# Patient Record
Sex: Male | Born: 1955
Health system: Southern US, Community
[De-identification: ages and names within clinical notes are randomized; demographics above are authoritative.]

## PROBLEM LIST (undated history)

## (undated) DIAGNOSIS — K5790 Diverticulosis of intestine, part unspecified, without perforation or abscess without bleeding: Secondary | ICD-10-CM

## (undated) DIAGNOSIS — Z789 Other specified health status: Secondary | ICD-10-CM

## (undated) HISTORY — PX: NO PAST SURGERIES: SHX2092

---

## 2001-07-08 ENCOUNTER — Ambulatory Visit (HOSPITAL_COMMUNITY): Admission: RE | Admit: 2001-07-08 | Discharge: 2001-07-08 | Payer: Self-pay | Admitting: Gastroenterology

## 2001-07-21 ENCOUNTER — Ambulatory Visit (HOSPITAL_COMMUNITY): Admission: RE | Admit: 2001-07-21 | Discharge: 2001-07-21 | Payer: Self-pay | Admitting: Gastroenterology

## 2001-07-21 ENCOUNTER — Encounter: Payer: Self-pay | Admitting: Gastroenterology

## 2003-10-30 ENCOUNTER — Emergency Department (HOSPITAL_COMMUNITY): Admission: EM | Admit: 2003-10-30 | Discharge: 2003-10-30 | Payer: Self-pay | Admitting: Family Medicine

## 2013-05-09 ENCOUNTER — Encounter (HOSPITAL_COMMUNITY): Payer: Self-pay | Admitting: Emergency Medicine

## 2013-05-09 DIAGNOSIS — Z79899 Other long term (current) drug therapy: Secondary | ICD-10-CM

## 2013-05-09 DIAGNOSIS — K659 Peritonitis, unspecified: Principal | ICD-10-CM | POA: Diagnosis present

## 2013-05-09 DIAGNOSIS — K5732 Diverticulitis of large intestine without perforation or abscess without bleeding: Secondary | ICD-10-CM | POA: Diagnosis present

## 2013-05-09 NOTE — ED Notes (Signed)
C/o LLQ pain since 3am.  Denies nausea and vomiting.  Reports constipation earlier today- took laxative and reports (liquid) BM 1 hour ago.  Pain worse after BM.

## 2013-05-10 ENCOUNTER — Encounter (HOSPITAL_COMMUNITY): Payer: Self-pay | Admitting: Radiology

## 2013-05-10 ENCOUNTER — Emergency Department (HOSPITAL_COMMUNITY): Payer: Self-pay

## 2013-05-10 ENCOUNTER — Inpatient Hospital Stay (HOSPITAL_COMMUNITY)
Admission: EM | Admit: 2013-05-10 | Discharge: 2013-05-14 | DRG: 372 | Disposition: A | Discharge status: Home / Self Care | Payer: Self-pay | Attending: Surgery | Admitting: Surgery

## 2013-05-10 DIAGNOSIS — K5720 Diverticulitis of large intestine with perforation and abscess without bleeding: Secondary | ICD-10-CM | POA: Diagnosis present

## 2013-05-10 DIAGNOSIS — K5792 Diverticulitis of intestine, part unspecified, without perforation or abscess without bleeding: Secondary | ICD-10-CM

## 2013-05-10 DIAGNOSIS — K5732 Diverticulitis of large intestine without perforation or abscess without bleeding: Secondary | ICD-10-CM

## 2013-05-10 HISTORY — DX: Other specified health status: Z78.9

## 2013-05-10 LAB — COMPREHENSIVE METABOLIC PANEL
ALT: 17 U/L (ref 0–53)
ALT: 21 U/L (ref 0–53)
Alkaline Phosphatase: 55 U/L (ref 39–117)
Alkaline Phosphatase: 57 U/L (ref 39–117)
BUN: 13 mg/dL (ref 6–23)
CO2: 26 mEq/L (ref 19–32)
CO2: 30 mEq/L (ref 19–32)
Calcium: 8.7 mg/dL (ref 8.4–10.5)
Calcium: 9.1 mg/dL (ref 8.4–10.5)
Chloride: 98 mEq/L (ref 96–112)
Creatinine, Ser: 1.12 mg/dL (ref 0.50–1.35)
GFR calc Af Amer: 83 mL/min — ABNORMAL LOW (ref >90)
GFR calc Af Amer: 88 mL/min — ABNORMAL LOW (ref >90)
GFR calc non Af Amer: 72 mL/min — ABNORMAL LOW (ref >90)
GFR calc non Af Amer: 76 mL/min — ABNORMAL LOW (ref >90)
Glucose, Bld: 116 mg/dL — ABNORMAL HIGH (ref 70–99)
Glucose, Bld: 121 mg/dL — ABNORMAL HIGH (ref 70–99)
Potassium: 3.5 mEq/L (ref 3.5–5.1)
Potassium: 3.5 mEq/L (ref 3.5–5.1)
Sodium: 133 mEq/L — ABNORMAL LOW (ref 135–145)
Sodium: 135 mEq/L (ref 135–145)
Total Bilirubin: 1.2 mg/dL (ref 0.3–1.2)
Total Bilirubin: 1.6 mg/dL — ABNORMAL HIGH (ref 0.3–1.2)
Total Protein: 7.8 g/dL (ref 6.0–8.3)

## 2013-05-10 LAB — CBC WITH DIFFERENTIAL/PLATELET
Basophils Absolute: 0 10*3/uL (ref 0.0–0.1)
Basophils Relative: 0 % (ref 0–1)
Eosinophils Absolute: 0 10*3/uL (ref 0.0–0.7)
Eosinophils Relative: 0 % (ref 0–5)
HCT: 42.7 % (ref 39.0–52.0)
Hemoglobin: 14.3 g/dL (ref 13.0–17.0)
Lymphs Abs: 1.9 10*3/uL (ref 0.7–4.0)
MCHC: 33.3 g/dL (ref 30.0–36.0)
MCV: 78.3 fL (ref 78.0–100.0)
Monocytes Relative: 8 % (ref 3–12)
Neutro Abs: 10.5 10*3/uL — ABNORMAL HIGH (ref 1.7–7.7)
Neutro Abs: 8.3 10*3/uL — ABNORMAL HIGH (ref 1.7–7.7)
Neutrophils Relative %: 75 % (ref 43–77)
Neutrophils Relative %: 84 % — ABNORMAL HIGH (ref 43–77)
Platelets: 165 10*3/uL (ref 150–400)
Platelets: 197 10*3/uL (ref 150–400)
RBC: 5.45 MIL/uL (ref 4.22–5.81)
RDW: 13.9 % (ref 11.5–15.5)
RDW: 14 % (ref 11.5–15.5)
WBC: 11.1 10*3/uL — ABNORMAL HIGH (ref 4.0–10.5)

## 2013-05-10 LAB — URINALYSIS, ROUTINE W REFLEX MICROSCOPIC
Bilirubin Urine: NEGATIVE
Glucose, UA: NEGATIVE mg/dL
Ketones, ur: NEGATIVE mg/dL
Leukocytes, UA: NEGATIVE
Nitrite: NEGATIVE
Protein, ur: NEGATIVE mg/dL
Specific Gravity, Urine: 1.005 (ref 1.005–1.030)
Urobilinogen, UA: 0.2 mg/dL (ref 0.0–1.0)
pH: 7 (ref 5.0–8.0)

## 2013-05-10 LAB — LIPASE, BLOOD: Lipase: 30 U/L (ref 11–59)

## 2013-05-10 LAB — URINE MICROSCOPIC-ADD ON

## 2013-05-10 LAB — GLUCOSE, CAPILLARY
Glucose-Capillary: 106 mg/dL — ABNORMAL HIGH (ref 70–99)
Glucose-Capillary: 108 mg/dL — ABNORMAL HIGH (ref 70–99)
Glucose-Capillary: 125 mg/dL — ABNORMAL HIGH (ref 70–99)

## 2013-05-10 MED ORDER — IOHEXOL 300 MG/ML  SOLN
100.0000 mL | Freq: Once | INTRAMUSCULAR | Status: AC | PRN
  Administered 2013-05-10: 100 mL via INTRAVENOUS

## 2013-05-10 MED ORDER — ONDANSETRON HCL 4 MG PO TABS: 4.0000 mg | ORAL_TABLET | Freq: Four times a day (QID) | ORAL | Status: DC | PRN

## 2013-05-10 MED ORDER — PIPERACILLIN-TAZOBACTAM 3.375 G IVPB 30 MIN
3.3750 g | Freq: Once | INTRAVENOUS | Status: AC
  Administered 2013-05-10: 3.375 g via INTRAVENOUS
  Filled 2013-05-10: qty 50

## 2013-05-10 MED ORDER — IOHEXOL 300 MG/ML  SOLN: 25.0000 mL | Freq: Once | INTRAMUSCULAR | Status: DC | PRN

## 2013-05-10 MED ORDER — CIPROFLOXACIN IN D5W 400 MG/200ML IV SOLN
400.0000 mg | Freq: Once | INTRAVENOUS | Status: AC
  Administered 2013-05-10: 400 mg via INTRAVENOUS
  Filled 2013-05-10: qty 200

## 2013-05-10 MED ORDER — HYDROMORPHONE HCL PF 1 MG/ML IJ SOLN
0.5000 mg | INTRAMUSCULAR | Status: DC | PRN
  Administered 2013-05-10 – 2013-05-13 (×9): 0.5 mg via INTRAVENOUS
  Filled 2013-05-10 (×9): qty 1

## 2013-05-10 MED ORDER — SODIUM CHLORIDE 0.9 % IV SOLN
INTRAVENOUS | Status: AC
  Administered 2013-05-10 – 2013-05-11 (×2): via INTRAVENOUS

## 2013-05-10 MED ORDER — ACETAMINOPHEN 650 MG RE SUPP: 650.0000 mg | Freq: Four times a day (QID) | RECTAL | Status: DC | PRN

## 2013-05-10 MED ORDER — ACETAMINOPHEN 325 MG PO TABS
650.0000 mg | ORAL_TABLET | Freq: Four times a day (QID) | ORAL | Status: DC | PRN
  Administered 2013-05-10 (×3): 650 mg via ORAL
  Filled 2013-05-10 (×3): qty 2

## 2013-05-10 MED ORDER — ENOXAPARIN SODIUM 40 MG/0.4ML ~~LOC~~ SOLN
40.0000 mg | SUBCUTANEOUS | Status: DC
  Administered 2013-05-10 – 2013-05-14 (×5): 40 mg via SUBCUTANEOUS
  Filled 2013-05-10 (×5): qty 0.4

## 2013-05-10 MED ORDER — ONDANSETRON HCL 4 MG/2ML IJ SOLN: 4.0000 mg | Freq: Four times a day (QID) | INTRAMUSCULAR | Status: DC | PRN

## 2013-05-10 MED ORDER — PIPERACILLIN-TAZOBACTAM 3.375 G IVPB
3.3750 g | Freq: Three times a day (TID) | INTRAVENOUS | Status: DC
  Administered 2013-05-10 – 2013-05-14 (×12): 3.375 g via INTRAVENOUS
  Filled 2013-05-10 (×14): qty 50

## 2013-05-10 MED ORDER — MORPHINE SULFATE 4 MG/ML IJ SOLN
4.0000 mg | Freq: Once | INTRAMUSCULAR | Status: AC
  Administered 2013-05-10: 4 mg via INTRAVENOUS
  Filled 2013-05-10: qty 1

## 2013-05-10 MED ORDER — METRONIDAZOLE IN NACL 5-0.79 MG/ML-% IV SOLN
500.0000 mg | Freq: Once | INTRAVENOUS | Status: AC
Start: 2013-05-10 — End: 2013-05-10
  Administered 2013-05-10: 500 mg via INTRAVENOUS
  Filled 2013-05-10: qty 100

## 2013-05-10 NOTE — ED Notes (Signed)
Pt states he has been having some ABD pain in his left lower quadrant.  Pt states the pain was worse and he thought that he might have just been constipated.  His family gave him some milk of magnesia which allowed him to have a liquid BM around 2 hours prior.

## 2013-05-10 NOTE — Progress Notes (Signed)
Pt has a fever of 101.7.  MD notified and 650mg  PO Tylenol given.  Will continue to monitor.  Hector Shade [place]

## 2013-05-10 NOTE — Progress Notes (Signed)
Subjective: Pt feels much better.  Still tender in LLQ.  No N/V.  Ambulating OOB.  Not hungry.  Urinating well and having flatus/BM's.  Objective: Vital signs in last 24 hours: Temp:  [98.8 F (37.1 C)-101 F (38.3 C)] 101 F (38.3 C) (12/08 0545) Pulse Rate:  [72-81] 78 (12/08 0545) Resp:  [18] 18 (12/07 2333) BP: (117-126)/(66-72) 117/72 mmHg (12/08 0545) SpO2:  [96 %-98 %] 98 % (12/08 0545) Weight:  [161 lb 3.2 oz (73.12 kg)] 161 lb 3.2 oz (73.12 kg) (12/08 0545) Last BM Date: 05/09/13  Intake/Output from previous day:   Intake/Output this shift:    PE: Gen:  Alert, NAD, pleasant Abd: Soft, ND, still moderately tender in LLQ, +BS, no HSM, no surgical scars noted   Lab Results:   Recent Labs  05/09/13 2355 05/10/13 0715  WBC 11.1* 12.5*  HGB 14.3 13.3  HCT 42.7 40.0  PLT 197 165   BMET  Recent Labs  05/09/13 2355 05/10/13 0715  NA 135 133*  K 3.5 3.5  CL 98 98  CO2 30 26  GLUCOSE 116* 121*  BUN 13 12  CREATININE 1.12 1.07  CALCIUM 9.1 8.7   PT/INR No results found for this basename: LABPROT, INR,  in the last 72 hours CMP     Component Value Date/Time   NA 133* 05/10/2013 0715   K 3.5 05/10/2013 0715   CL 98 05/10/2013 0715   CO2 26 05/10/2013 0715   GLUCOSE 121* 05/10/2013 0715   BUN 12 05/10/2013 0715   CREATININE 1.07 05/10/2013 0715   CALCIUM 8.7 05/10/2013 0715   PROT 7.4 05/10/2013 0715   ALBUMIN 3.5 05/10/2013 0715   AST 16 05/10/2013 0715   ALT 17 05/10/2013 0715   ALKPHOS 55 05/10/2013 0715   BILITOT 1.6* 05/10/2013 0715   GFRNONAA 76* 05/10/2013 0715   GFRAA 88* 05/10/2013 0715   Lipase     Component Value Date/Time   LIPASE 30 05/09/2013 2355       Studies/Results: Ct Abdomen Pelvis W Contrast  05/10/2013   CLINICAL DATA:  Left lower quadrant abdominal pain.  EXAM: CT ABDOMEN AND PELVIS WITH CONTRAST  TECHNIQUE: Multidetector CT imaging of the abdomen and pelvis was performed using the standard protocol following bolus  administration of intravenous contrast.  CONTRAST:  OMNIPAQUE IOHEXOL 300 MG/ML  SOLN  COMPARISON:  None.  FINDINGS: Minimal bibasilar atelectasis is noted.  The liver and spleen are unremarkable in appearance. The gallbladder is within normal limits. The pancreas and adrenal glands are unremarkable.  The kidneys are unremarkable in appearance. There is no evidence of hydronephrosis. No renal or ureteral stones are seen. No perinephric stranding is appreciated.  No free fluid is identified. The small bowel is unremarkable in appearance. The stomach is within normal limits. No acute vascular abnormalities are seen.  There is focal soft tissue inflammation along the proximal sigmoid colon, with associated diverticula, compatible with acute diverticulitis. A small amount of associated free fluid is seen tracking at the left lower quadrant, with a focus of free air. A tiny focus of air is noted under the diaphragm. Findings are concerning for a small focal perforation, relatively contained in nature.  Scattered diverticulosis is noted along the descending and proximal sigmoid colon.  The appendix is normal in caliber, without definite evidence of appendicitis.  The bladder is mildly distended and grossly unremarkable in appearance. The prostate remains normal in size. No inguinal lymphadenopathy is seen.  No acute osseous abnormalities  are identified.  IMPRESSION: 1. Focal soft tissue inflammation along the proximal sigmoid colon, with associated diverticula, compatible with acute diverticulitis. Small amount of associated free fluid seen at the left lower quadrant, with a focus of free air noted. Tiny focus of air also noted under the diaphragm. Findings concerning for small focal perforation, relatively contained in nature. No evidence of abscess. 2. Scattered diverticulosis noted along the descending and proximal sigmoid colon.  These results were discussed in person at the time of interpretation on 05/10/2013  at 3:20 AM with Dr. Marisa Severin , who verbally acknowledged these results.   Electronically Signed   By: Roanna Raider M.D.   On: 05/10/2013 03:35    Anti-infectives: Anti-infectives   Start     Dose/Rate Route Frequency Ordered Stop   05/10/13 1200  piperacillin-tazobactam (ZOSYN) IVPB 3.375 g     3.375 g 12.5 mL/hr over 240 Minutes Intravenous Every 8 hours 05/10/13 0551     05/10/13 0600  piperacillin-tazobactam (ZOSYN) IVPB 3.375 g     3.375 g 100 mL/hr over 30 Minutes Intravenous  Once 05/10/13 0551 05/10/13 0703   05/10/13 0345  metroNIDAZOLE (FLAGYL) IVPB 500 mg     500 mg 100 mL/hr over 60 Minutes Intravenous  Once 05/10/13 0333 05/10/13 0450   05/10/13 0345  ciprofloxacin (CIPRO) IVPB 400 mg     400 mg 200 mL/hr over 60 Minutes Intravenous  Once 05/10/13 0333 05/10/13 0506       Assessment/Plan HD #2 Sigmoid diverticulitis with microperforation and localized peritonitis Leukocytosis 1.  NPO, IVF, pain control, antiemetics, IV antibiotics 2.  Hopefully can treat conservatively for now and avoid surgical intervention, hopefully advance diet tomorrow to clears 3.  Follow WBC (went up to 12.5 today from 11.1), repeat tomorrow 4.  Ambulate and IS 5.  SCD's and lovenox 6.  Will discuss with Dr. Gerrit Friends about taking him on our service    LOS: 0 days    Aris [place] 05/10/2013, 8:46 AM Pager: [phone]

## 2013-05-10 NOTE — H&P (Signed)
[facility] History and Physical  Jonathan Boyer HYQ:[id number] DOB: [birth year] DOA: 05/10/2013  Referring physician: ER physician. PCP: No primary provider on file.   Chief Complaint: Abdominal pain.  HPI: Jonathan Boyer is a 57 y.o. male with no significant past medical history presented to the ER with complaints of left lower quadrant pain. Patient's symptoms started yesterday morning with sudden onset of pain and he had to leave his work place due to the pain. Pain remained constant. Later because he did not have a bowel movement he tried milk of magnesia with no results. Due to the persistent nature of the symptoms he came to the ER and CT abdomen pelvis done showed sigmoid diverticulitis with perforation. On-call surgeon Dr. Luisa Hart was consulted. Patient has been admitted for further management. Patient denies any chest pain or shortness of breath nausea vomiting.  Review of Systems: As presented in the history of presenting illness, rest negative.  Past Medical History  Diagnosis Date  . Medical history non-contributory    Past Surgical History  Procedure Laterality Date  . No past surgeries     Social History:  reports that he has never smoked. He does not have any smokeless tobacco history on file. He reports that he drinks alcohol. He reports that he does not use illicit drugs. Where does patient live home. Can patient participate in ADLs? Yes.  No Known Allergies  Family History:  Family History  Problem Relation Age of Onset  . Cancer Mother       Prior to Admission medications   Medication Sig Start Date End Date Taking? Authorizing Provider  ibuprofen (ADVIL,MOTRIN) 200 MG tablet Take 200 mg by mouth every 6 (six) hours as needed.   Yes Historical Provider, MD  magnesium hydroxide (MILK OF MAGNESIA) 400 MG/5ML suspension Take 45 mLs by mouth 2 (two) times daily as needed for indigestion.    Yes Historical Provider, MD    Physical Exam: Filed Vitals:   05/10/13 0210 05/10/13 0215 05/10/13 0451 05/10/13 0500  BP:  126/70 122/72 122/66  Pulse: 79 76 81 75  Temp:      TempSrc:      Resp:      SpO2: 96% 97% 98% 97%     General:  Well-developed well-nourished.  Eyes: Anicteric no pallor.  ENT: No discharge from ears eyes nose mouth.  Neck: No mass felt.  Cardiovascular: S1-S2 heard.  Respiratory: No rhonchi or crepitations.  Abdomen: Left lower quadrant tenderness no guarding or rigidity.  Skin: No rash.  Musculoskeletal: No edema.  Psychiatric: Appears normal.  Neurologic: Alert awake oriented to time place and person. Moves all extremities.  Labs on Admission:  Basic Metabolic Panel:  Recent Labs Lab 05/09/13 2355  NA 135  K 3.5  CL 98  CO2 30  GLUCOSE 116*  BUN 13  CREATININE 1.12  CALCIUM 9.1   Liver Function Tests:  Recent Labs Lab 05/09/13 2355  AST 20  ALT 21  ALKPHOS 57  BILITOT 1.2  PROT 7.8  ALBUMIN 3.8    Recent Labs Lab 05/09/13 2355  LIPASE 30   No results found for this basename: AMMONIA,  in the last 168 hours CBC:  Recent Labs Lab 05/09/13 2355  WBC 11.1*  NEUTROABS 8.3*  HGB 14.3  HCT 42.7  MCV 78.3  PLT 197   Cardiac Enzymes: No results found for this basename: CKTOTAL, CKMB, CKMBINDEX, TROPONINI,  in the last 168 hours  BNP (last 3 results) No results found for  this basename: PROBNP,  in the last 8760 hours CBG: No results found for this basename: GLUCAP,  in the last 168 hours  Radiological Exams on Admission: Ct Abdomen Pelvis W Contrast  05/10/2013   CLINICAL DATA:  Left lower quadrant abdominal pain.  EXAM: CT ABDOMEN AND PELVIS WITH CONTRAST  TECHNIQUE: Multidetector CT imaging of the abdomen and pelvis was performed using the standard protocol following bolus administration of intravenous contrast.  CONTRAST:  OMNIPAQUE IOHEXOL 300 MG/ML  SOLN  COMPARISON:  None.  FINDINGS: Minimal bibasilar atelectasis is noted.  The liver and spleen are unremarkable  in appearance. The gallbladder is within normal limits. The pancreas and adrenal glands are unremarkable.  The kidneys are unremarkable in appearance. There is no evidence of hydronephrosis. No renal or ureteral stones are seen. No perinephric stranding is appreciated.  No free fluid is identified. The small bowel is unremarkable in appearance. The stomach is within normal limits. No acute vascular abnormalities are seen.  There is focal soft tissue inflammation along the proximal sigmoid colon, with associated diverticula, compatible with acute diverticulitis. A small amount of associated free fluid is seen tracking at the left lower quadrant, with a focus of free air. A tiny focus of air is noted under the diaphragm. Findings are concerning for a small focal perforation, relatively contained in nature.  Scattered diverticulosis is noted along the descending and proximal sigmoid colon.  The appendix is normal in caliber, without definite evidence of appendicitis.  The bladder is mildly distended and grossly unremarkable in appearance. The prostate remains normal in size. No inguinal lymphadenopathy is seen.  No acute osseous abnormalities are identified.  IMPRESSION: 1. Focal soft tissue inflammation along the proximal sigmoid colon, with associated diverticula, compatible with acute diverticulitis. Small amount of associated free fluid seen at the left lower quadrant, with a focus of free air noted. Tiny focus of air also noted under the diaphragm. Findings concerning for small focal perforation, relatively contained in nature. No evidence of abscess. 2. Scattered diverticulosis noted along the descending and proximal sigmoid colon.  These results were discussed in person at the time of interpretation on 05/10/2013 at 3:20 AM with Dr. Marisa Severin , who verbally acknowledged these results.   Electronically Signed   By: Roanna Raider M.D.   On: 05/10/2013 03:35     Assessment/Plan Principal Problem:    Diverticulitis of colon with perforation Active Problems:   Diverticulitis   1. Diverticulitis of the sigmoid colon with perforation - patient has been placed n.p.o. IV antibiotics with gentle hydration. Pain relief medications. Further recommendations per surgery.    Code Status: Full code.  Family Communication: Family at the bedside.  Disposition Plan: Admit to inpatient.    KAKRAKANDY,ARSHAD N. [facility] Pager [phone].  If 7PM-7AM, please contact night-coverage [email or web address] Password [email or web address] 05/10/2013, 5:07 AM

## 2013-05-10 NOTE — ED Provider Notes (Signed)
CSN: [id number]     Arrival date & time 05/09/13  2319 History   First MD Initiated Contact with Patient 05/10/13 0142     Chief Complaint  Patient presents with  . Abdominal Pain   (Consider location/radiation/quality/duration/timing/severity/associated sxs/prior Treatment) HPI 57 year old male presents to emergency room with complaint of left lower quadrant pain.  Pain started at 3 AM yesterday, and has been persistent.  He has taken 2 doses of milk of magnesia as he pain.  May be due to constipation.  He has had liquid stool, but pain has continued.  He denies any fever, denies any nausea or vomiting.  No prior history of same.  Patient does not go to Dr. in regular basis, and reports no medical problems.  He denies any medical allergies.  No prior surgeries. Past Medical History  Diagnosis Date  . Medical history non-contributory    Past Surgical History  Procedure Laterality Date  . No past surgeries     Family History  Problem Relation Age of Onset  . Cancer Mother    History  Substance Use Topics  . Smoking status: Never Smoker   . Smokeless tobacco: Not on file  . Alcohol Use: Yes     Comment: occasional    Review of Systems  See History of Present Illness; otherwise all other systems are reviewed and negative Allergies  Review of patient's allergies indicates no known allergies.  Home Medications  No current outpatient prescriptions on file. BP 117/72  Pulse 78  Temp(Src) 101 F (38.3 C) (Oral)  Resp 18  Ht 5\' 3"  (1.6 m)  Wt 161 lb 3.2 oz (73.12 kg)  BMI 28.56 kg/m2  SpO2 98% Physical Exam  Nursing note and vitals reviewed. Constitutional: He is oriented to person, place, and time. He appears well-developed and well-nourished. He appears distressed (uncomfortable appearing).  HENT:  Head: Normocephalic and atraumatic.  Nose: Nose normal.  Mouth/Throat: Oropharynx is clear and moist.  Eyes: Conjunctivae and EOM are normal. Pupils are equal, round, and  reactive to light.  Neck: Normal range of motion. Neck supple. No JVD present. No tracheal deviation present. No thyromegaly present.  Cardiovascular: Normal rate, regular rhythm, normal heart sounds and intact distal pulses.  Exam reveals no gallop and no friction rub.   No murmur heard. Pulmonary/Chest: Effort normal and breath sounds normal. No stridor. No respiratory distress. He has no wheezes. He has no rales. He exhibits no tenderness.  Abdominal: Soft. Bowel sounds are normal. He exhibits no distension and no mass. There is tenderness (tender to palpation and left lower quadrant). There is rebound. There is no guarding.  Musculoskeletal: Normal range of motion. He exhibits no edema and no tenderness.  Lymphadenopathy:    He has no cervical adenopathy.  Neurological: He is alert and oriented to person, place, and time. He exhibits normal muscle tone. Coordination normal.  Skin: Skin is warm and dry. No rash noted. No erythema. No pallor.  Psychiatric: He has a normal mood and affect. His behavior is normal. Judgment and thought content normal.    ED Course  Procedures (including critical care time) Labs Review Labs Reviewed  CBC WITH DIFFERENTIAL - Abnormal; Notable for the following:    WBC 11.1 (*)    Neutro Abs 8.3 (*)    All other components within normal limits  COMPREHENSIVE METABOLIC PANEL - Abnormal; Notable for the following:    Glucose, Bld 116 (*)    GFR calc non Af Amer 72 (*)  GFR calc Af Amer 83 (*)    All other components within normal limits  URINALYSIS, ROUTINE W REFLEX MICROSCOPIC - Abnormal; Notable for the following:    Hgb urine dipstick TRACE (*)    All other components within normal limits  LIPASE, BLOOD  URINE MICROSCOPIC-ADD ON  COMPREHENSIVE METABOLIC PANEL  CBC WITH DIFFERENTIAL  TSH   Imaging Review Ct Abdomen Pelvis W Contrast  05/10/2013   CLINICAL DATA:  Left lower quadrant abdominal pain.  EXAM: CT ABDOMEN AND PELVIS WITH CONTRAST   TECHNIQUE: Multidetector CT imaging of the abdomen and pelvis was performed using the standard protocol following bolus administration of intravenous contrast.  CONTRAST:  OMNIPAQUE IOHEXOL 300 MG/ML  SOLN  COMPARISON:  None.  FINDINGS: Minimal bibasilar atelectasis is noted.  The liver and spleen are unremarkable in appearance. The gallbladder is within normal limits. The pancreas and adrenal glands are unremarkable.  The kidneys are unremarkable in appearance. There is no evidence of hydronephrosis. No renal or ureteral stones are seen. No perinephric stranding is appreciated.  No free fluid is identified. The small bowel is unremarkable in appearance. The stomach is within normal limits. No acute vascular abnormalities are seen.  There is focal soft tissue inflammation along the proximal sigmoid colon, with associated diverticula, compatible with acute diverticulitis. A small amount of associated free fluid is seen tracking at the left lower quadrant, with a focus of free air. A tiny focus of air is noted under the diaphragm. Findings are concerning for a small focal perforation, relatively contained in nature.  Scattered diverticulosis is noted along the descending and proximal sigmoid colon.  The appendix is normal in caliber, without definite evidence of appendicitis.  The bladder is mildly distended and grossly unremarkable in appearance. The prostate remains normal in size. No inguinal lymphadenopathy is seen.  No acute osseous abnormalities are identified.  IMPRESSION: 1. Focal soft tissue inflammation along the proximal sigmoid colon, with associated diverticula, compatible with acute diverticulitis. Small amount of associated free fluid seen at the left lower quadrant, with a focus of free air noted. Tiny focus of air also noted under the diaphragm. Findings concerning for small focal perforation, relatively contained in nature. No evidence of abscess. 2. Scattered diverticulosis noted along the  descending and proximal sigmoid colon.  These results were discussed in person at the time of interpretation on 05/10/2013 at 3:20 AM with Dr. Marisa Severin , who verbally acknowledged these results.   Electronically Signed   By: Roanna Raider M.D.   On: 05/10/2013 03:35    EKG Interpretation    Date/Time:    Ventricular Rate:    PR Interval:    QRS Duration:   QT Interval:    QTC Calculation:   R Axis:     Text Interpretation:              MDM   1. Acute diverticulitis   2. Diverticulitis of colon with perforation    57 year old male with left lower quadrant pain.  Concern for diverticulitis.  CT scan shows diverticulitis, with free fluid, probable small focal perforation.  Case discussed with hospitalist, and surgery, who will consult.  Started on antibiotics.  Results discussed with family and patient.  Plan for admission with IV antibiotics    Olivia Mackie, MD 05/10/13 [phone]

## 2013-05-10 NOTE — Progress Notes (Signed)
57yo male c/o LLQ pain since 0300 yesterday, CT c/w acute diverticulitis, to begin IV ABX.  Will start Zosyn 3.375g IV for SCr 1.12 and monitor.  Vernard Gambles, PharmD, BCPS  05/10/2013 5:52 AM

## 2013-05-10 NOTE — Progress Notes (Signed)
General Surgery [facility]  Patient seen and examined.  Family at bedside.  Complains of LLQ pain.  Low grade fever today.  WBC slight increased today.  Continue IV abx.  Allow clear liquids.  Repeat CBC in AM.  Velora Heckler, MD, [facility] Office: [phone]

## 2013-05-10 NOTE — Consult Note (Signed)
Reason for Consult:diverticulitis Referring Physician: Dr Jonathan Boyer is an 57 y.o. male.  HPI: 1 day hx of LLQ abdominal pain sharp non radiating left work today since pain got worse.  No appetite.  Pain worsening.  Hurts to move.  No diarrhea or vomiting.   History reviewed. No pertinent past medical history.  History reviewed. No pertinent past surgical history.  No family history on file.  Social History:  reports that he has never smoked. He does not have any smokeless tobacco history on file. He reports that he drinks alcohol. He reports that he does not use illicit drugs.  Allergies: No Known Allergies  Medications: I have reviewed the patient's current medications.  Results for orders placed during the hospital encounter of 05/10/13 (from the past 48 hour(s))  CBC WITH DIFFERENTIAL     Status: Abnormal   Collection Time    05/09/13 11:55 PM      Result Value Range   WBC 11.1 (*) 4.0 - 10.5 K/uL   RBC 5.45  4.22 - 5.81 MIL/uL   Hemoglobin 14.3  13.0 - 17.0 g/dL   HCT 16.1  09.6 - 04.5 %   MCV 78.3  78.0 - 100.0 fL   MCH 26.2  26.0 - 34.0 pg   MCHC 33.5  30.0 - 36.0 g/dL   RDW 40.9  81.1 - 91.4 %   Platelets 197  150 - 400 K/uL   Neutrophils Relative % 75  43 - 77 %   Neutro Abs 8.3 (*) 1.7 - 7.7 K/uL   Lymphocytes Relative 17  12 - 46 %   Lymphs Abs 1.9  0.7 - 4.0 K/uL   Monocytes Relative 8  3 - 12 %   Monocytes Absolute 0.8  0.1 - 1.0 K/uL   Eosinophils Relative 0  0 - 5 %   Eosinophils Absolute 0.0  0.0 - 0.7 K/uL   Basophils Relative 0  0 - 1 %   Basophils Absolute 0.0  0.0 - 0.1 K/uL  COMPREHENSIVE METABOLIC PANEL     Status: Abnormal   Collection Time    05/09/13 11:55 PM      Result Value Range   Sodium 135  135 - 145 mEq/L   Potassium 3.5  3.5 - 5.1 mEq/L   Chloride 98  96 - 112 mEq/L   CO2 30  19 - 32 mEq/L   Glucose, Bld 116 (*) 70 - 99 mg/dL   BUN 13  6 - 23 mg/dL   Creatinine, Ser 7.82  0.50 - 1.35 mg/dL   Calcium 9.1  8.4 - 95.6 mg/dL    Total Protein 7.8  6.0 - 8.3 g/dL   Albumin 3.8  3.5 - 5.2 g/dL   AST 20  0 - 37 U/L   ALT 21  0 - 53 U/L   Alkaline Phosphatase 57  39 - 117 U/L   Total Bilirubin 1.2  0.3 - 1.2 mg/dL   GFR calc non Af Amer 72 (*) >90 mL/min   GFR calc Af Amer 83 (*) >90 mL/min   Comment: (NOTE)     The eGFR has been calculated using the CKD EPI equation.     This calculation has not been validated in all clinical situations.     eGFR's persistently <90 mL/min signify possible Chronic Kidney     Disease.  LIPASE, BLOOD     Status: None   Collection Time    05/09/13 11:55 PM  Result Value Range   Lipase 30  11 - 59 U/L    Ct Abdomen Pelvis W Contrast  05/10/2013   CLINICAL DATA:  Left lower quadrant abdominal pain.  EXAM: CT ABDOMEN AND PELVIS WITH CONTRAST  TECHNIQUE: Multidetector CT imaging of the abdomen and pelvis was performed using the standard protocol following bolus administration of intravenous contrast.  CONTRAST:  OMNIPAQUE IOHEXOL 300 MG/ML  SOLN  COMPARISON:  None.  FINDINGS: Minimal bibasilar atelectasis is noted.  The liver and spleen are unremarkable in appearance. The gallbladder is within normal limits. The pancreas and adrenal glands are unremarkable.  The kidneys are unremarkable in appearance. There is no evidence of hydronephrosis. No renal or ureteral stones are seen. No perinephric stranding is appreciated.  No free fluid is identified. The small bowel is unremarkable in appearance. The stomach is within normal limits. No acute vascular abnormalities are seen.  There is focal soft tissue inflammation along the proximal sigmoid colon, with associated diverticula, compatible with acute diverticulitis. A small amount of associated free fluid is seen tracking at the left lower quadrant, with a focus of free air. A tiny focus of air is noted under the diaphragm. Findings are concerning for a small focal perforation, relatively contained in nature.  Scattered diverticulosis is  noted along the descending and proximal sigmoid colon.  The appendix is normal in caliber, without definite evidence of appendicitis.  The bladder is mildly distended and grossly unremarkable in appearance. The prostate remains normal in size. No inguinal lymphadenopathy is seen.  No acute osseous abnormalities are identified.  IMPRESSION: 1. Focal soft tissue inflammation along the proximal sigmoid colon, with associated diverticula, compatible with acute diverticulitis. Small amount of associated free fluid seen at the left lower quadrant, with a focus of free air noted. Tiny focus of air also noted under the diaphragm. Findings concerning for small focal perforation, relatively contained in nature. No evidence of abscess. 2. Scattered diverticulosis noted along the descending and proximal sigmoid colon.  These results were discussed in person at the time of interpretation on 05/10/2013 at 3:20 AM with Dr. Marisa Severin , who verbally acknowledged these results.   Electronically Signed   By: Roanna Raider M.D.   On: 05/10/2013 03:35    Review of Systems  Constitutional: Positive for malaise/fatigue. Negative for fever and chills.  HENT: Negative.   Respiratory: Negative.   Cardiovascular: Negative.   Gastrointestinal: Positive for abdominal pain.  Genitourinary: Negative.   Musculoskeletal: Negative for joint pain.  Skin: Negative.   Neurological: Negative.   Endo/Heme/Allergies: Negative.   Psychiatric/Behavioral: Negative.    Blood pressure 126/69, pulse 79, temperature 98.8 F (37.1 C), temperature source Oral, resp. rate 18, SpO2 96.00%. Physical Exam  Constitutional: He is oriented to person, place, and time. He appears well-developed and well-nourished.  HENT:  Head: Normocephalic and atraumatic.  Eyes: Conjunctivae are normal. No scleral icterus.  Neck: Normal range of motion. Neck supple.  Cardiovascular: Normal rate and regular rhythm.   Respiratory: Effort normal.  GI: Soft. He  exhibits distension. There is tenderness in the left lower quadrant. There is guarding.  Musculoskeletal: Normal range of motion.  Neurological: He is alert and oriented to person, place, and time.  Skin: Skin is warm and dry.  Psychiatric: He has a normal mood and affect. His behavior is normal. Judgment and thought content normal.    Assessment/Plan: Sigmoid diverticulitis with microperforation and localized peritonitis No acute surgical need IV ABX/bowel rest/ IVF Medicine to  admit Will follow   Jonathan Boyer A. 05/10/2013, 4:13 AM

## 2013-05-11 DIAGNOSIS — D72829 Elevated white blood cell count, unspecified: Secondary | ICD-10-CM

## 2013-05-11 LAB — CBC
HCT: 38.2 % — ABNORMAL LOW (ref 39.0–52.0)
MCH: 25.6 pg — ABNORMAL LOW (ref 26.0–34.0)
MCV: 79.6 fL (ref 78.0–100.0)
RBC: 4.8 MIL/uL (ref 4.22–5.81)
RDW: 14 % (ref 11.5–15.5)
WBC: 11 10*3/uL — ABNORMAL HIGH (ref 4.0–10.5)

## 2013-05-11 LAB — GLUCOSE, CAPILLARY
Glucose-Capillary: 113 mg/dL — ABNORMAL HIGH (ref 70–99)
Glucose-Capillary: 101 mg/dL — ABNORMAL HIGH (ref 70–99)

## 2013-05-11 LAB — BASIC METABOLIC PANEL
CO2: 26 mEq/L (ref 19–32)
Calcium: 8.4 mg/dL (ref 8.4–10.5)
Chloride: 100 mEq/L (ref 96–112)
Creatinine, Ser: 1.12 mg/dL (ref 0.50–1.35)
Glucose, Bld: 103 mg/dL — ABNORMAL HIGH (ref 70–99)
Potassium: 3.9 mEq/L (ref 3.5–5.1)

## 2013-05-11 MED ORDER — POTASSIUM CHLORIDE IN NACL 20-0.9 MEQ/L-% IV SOLN
INTRAVENOUS | Status: DC
  Administered 2013-05-11 – 2013-05-12 (×3): via INTRAVENOUS
  Administered 2013-05-12: 100 mL/h via INTRAVENOUS
  Administered 2013-05-13: 16:00:00 via INTRAVENOUS
  Administered 2013-05-13: 100 mL/h via INTRAVENOUS
  Administered 2013-05-14: 01:00:00 via INTRAVENOUS
  Filled 2013-05-11 (×9): qty 1000

## 2013-05-11 NOTE — Clinical Documentation Improvement (Signed)
THIS DOCUMENT IS NOT A PERMANENT PART OF THE MEDICAL RECORD  Please update your documentation with the medical record to reflect your response to this query. If you need help, please call [phone]  05/11/13  Dear Associates,  In a better effort to capture your patient's severity of illness, reflect appropriate length of stay and utilization of resources, a review of the patient medical record has revealed the following indicators. Based on your clinical judgment, please clarify and document in a progress note and/or discharge summary the clinical condition associated with the following supporting information: In responding to this query please exercise your independent judgment.  The fact that a query is asked, does not imply that any particular answer is desired or expected.   Possible Clinical Conditions?  Septicemia / Sepsis  SIRS  Other Condition   Cannot clinically Determine  Risk Factors: Peritonitis; Diverticulitis of colon   Signs and Symptoms:  WBC: 11.1; 12.5; Temperature: 101; 101.7;   Diagnostics: IMPRESSION:  1. Focal soft tissue inflammation along the proximal sigmoid colon,  with associated diverticula, compatible with acute diverticulitis.  Small amount of associated free fluid seen at the left lower  quadrant, with a focus of free air noted. Tiny focus of air also  noted under the diaphragm. Findings concerning for small focal  perforation, relatively contained in nature. No evidence of abscess.  2. Scattered diverticulosis noted along the descending and proximal  sigmoid colon.  These results were discussed in person at the time of interpretation  on 05/10/2013 at 3:20 AM with Dr. Marisa Severin , who verbally  acknowledged these results  Treatment: Zosyn, flagyl, cipro  Reviewed:  no additional documentation provided [facility]  Thank You,  Amada Kingfisher  RN, BSN, CCM Clinical Documentation Specialist: Health Information Management [facility] [phone] [email or web address]

## 2013-05-11 NOTE — Progress Notes (Signed)
Subjective: Pt was doing well yesterday and was advanced to clear liquids.  Last night and this morning he developed worsening LLQ abdominal pain.  Thus he was backed off to NPO today.  Pt continues to have flatus and watery BM's.  Urinating well.  Ambulating OOB.    Objective: Vital signs in last 24 hours: Temp:  [97.3 F (36.3 C)-101.7 F (38.7 C)] 99.3 F (37.4 C) (12/09 0502) Pulse Rate:  [72-78] 72 (12/09 0502) Resp:  [17-18] 18 (12/09 0502) BP: (98-121)/(59-66) 118/63 mmHg (12/09 0502) SpO2:  [96 %-100 %] 96 % (12/09 0502) Last BM Date: 05/10/13  Intake/Output from previous day: 12/08 0701 - 12/09 0700 In: 2755 [I.V.:2755] Out: -  Intake/Output this shift:    PE: Gen:  Alert, NAD, pleasant Abd: Soft, moderate tenderness in LLQ, ND, +BS, no HSM   Lab Results:   Recent Labs  05/10/13 0715 05/11/13 0520  WBC 12.5* 11.0*  HGB 13.3 12.3*  HCT 40.0 38.2*  PLT 165 179   BMET  Recent Labs  05/10/13 0715 05/11/13 0520  NA 133* 136  K 3.5 3.9  CL 98 100  CO2 26 26  GLUCOSE 121* 103*  BUN 12 10  CREATININE 1.07 1.12  CALCIUM 8.7 8.4   PT/INR No results found for this basename: LABPROT, INR,  in the last 72 hours CMP     Component Value Date/Time   NA 136 05/11/2013 0520   K 3.9 05/11/2013 0520   CL 100 05/11/2013 0520   CO2 26 05/11/2013 0520   GLUCOSE 103* 05/11/2013 0520   BUN 10 05/11/2013 0520   CREATININE 1.12 05/11/2013 0520   CALCIUM 8.4 05/11/2013 0520   PROT 7.4 05/10/2013 0715   ALBUMIN 3.5 05/10/2013 0715   AST 16 05/10/2013 0715   ALT 17 05/10/2013 0715   ALKPHOS 55 05/10/2013 0715   BILITOT 1.6* 05/10/2013 0715   GFRNONAA 72* 05/11/2013 0520   GFRAA 83* 05/11/2013 0520   Lipase     Component Value Date/Time   LIPASE 30 05/09/2013 2355       Studies/Results: Ct Abdomen Pelvis W Contrast  05/10/2013   CLINICAL DATA:  Left lower quadrant abdominal pain.  EXAM: CT ABDOMEN AND PELVIS WITH CONTRAST  TECHNIQUE: Multidetector CT imaging of the  abdomen and pelvis was performed using the standard protocol following bolus administration of intravenous contrast.  CONTRAST:  OMNIPAQUE IOHEXOL 300 MG/ML  SOLN  COMPARISON:  None.  FINDINGS: Minimal bibasilar atelectasis is noted.  The liver and spleen are unremarkable in appearance. The gallbladder is within normal limits. The pancreas and adrenal glands are unremarkable.  The kidneys are unremarkable in appearance. There is no evidence of hydronephrosis. No renal or ureteral stones are seen. No perinephric stranding is appreciated.  No free fluid is identified. The small bowel is unremarkable in appearance. The stomach is within normal limits. No acute vascular abnormalities are seen.  There is focal soft tissue inflammation along the proximal sigmoid colon, with associated diverticula, compatible with acute diverticulitis. A small amount of associated free fluid is seen tracking at the left lower quadrant, with a focus of free air. A tiny focus of air is noted under the diaphragm. Findings are concerning for a small focal perforation, relatively contained in nature.  Scattered diverticulosis is noted along the descending and proximal sigmoid colon.  The appendix is normal in caliber, without definite evidence of appendicitis.  The bladder is mildly distended and grossly unremarkable in appearance. The prostate remains  normal in size. No inguinal lymphadenopathy is seen.  No acute osseous abnormalities are identified.  IMPRESSION: 1. Focal soft tissue inflammation along the proximal sigmoid colon, with associated diverticula, compatible with acute diverticulitis. Small amount of associated free fluid seen at the left lower quadrant, with a focus of free air noted. Tiny focus of air also noted under the diaphragm. Findings concerning for small focal perforation, relatively contained in nature. No evidence of abscess. 2. Scattered diverticulosis noted along the descending and proximal sigmoid colon.  These  results were discussed in person at the time of interpretation on 05/10/2013 at 3:20 AM with Dr. Marisa Severin , who verbally acknowledged these results.   Electronically Signed   By: Roanna Raider M.D.   On: 05/10/2013 03:35    Anti-infectives: Anti-infectives   Start     Dose/Rate Route Frequency Ordered Stop   05/10/13 1200  piperacillin-tazobactam (ZOSYN) IVPB 3.375 g     3.375 g 12.5 mL/hr over 240 Minutes Intravenous Every 8 hours 05/10/13 0551     05/10/13 0600  piperacillin-tazobactam (ZOSYN) IVPB 3.375 g     3.375 g 100 mL/hr over 30 Minutes Intravenous  Once 05/10/13 0551 05/10/13 0703   05/10/13 0345  metroNIDAZOLE (FLAGYL) IVPB 500 mg     500 mg 100 mL/hr over 60 Minutes Intravenous  Once 05/10/13 0333 05/10/13 0450   05/10/13 0345  ciprofloxacin (CIPRO) IVPB 400 mg     400 mg 200 mL/hr over 60 Minutes Intravenous  Once 05/10/13 0333 05/10/13 0506       Assessment/Plan HD #3 Sigmoid diverticulitis with microperforation and localized peritonitis  Leukocytosis - improved to 11.0, afebrile over last 18 hours 1. Back off to NPO due to pain, IVF, pain control, antiemetics, IV antibiotics (only had 3 doses of zosyn so far; had 1 dose of cipro/flagyl) 2. Hopefully can treat conservatively for now and avoid surgical intervention, consider repeat CT and/or changing to Invanz 3. Follow WBC, repeat tomorrow  4. Ambulate and IS  5. SCD's and lovenox       LOS: 1 day    Aris Georgia 05/11/2013, 8:23 AM Pager: [phone]

## 2013-05-11 NOTE — Progress Notes (Signed)
I have seen and examined the pt and agree with PA-Dort's progress note. Pt with more abd pain today Agree with con't NPO Encourage OOBTC and ambulation Cont' abx- WBCs trending down again

## 2013-05-12 LAB — CBC
Hemoglobin: 12.4 g/dL — ABNORMAL LOW (ref 13.0–17.0)
Platelets: 193 10*3/uL (ref 150–400)
RBC: 4.7 MIL/uL (ref 4.22–5.81)
WBC: 8.7 10*3/uL (ref 4.0–10.5)

## 2013-05-12 LAB — BASIC METABOLIC PANEL
CO2: 23 mEq/L (ref 19–32)
Chloride: 100 mEq/L (ref 96–112)
Glucose, Bld: 81 mg/dL (ref 70–99)
Potassium: 4 mEq/L (ref 3.5–5.1)
Sodium: 133 mEq/L — ABNORMAL LOW (ref 135–145)

## 2013-05-12 NOTE — Progress Notes (Signed)
Subjective: Pt feeling much better.  His pain has been significantly improved with using dilaudid q4h.  He has gone the last 5 hours without pain medication and he does not have any pain.  He's ambulating well.  He's hungry and thirsty.  He's having flatus and BM's.    Objective: Vital signs in last 24 hours: Temp:  [98.8 F (37.1 C)-100.5 F (38.1 C)] 98.8 F (37.1 C) (12/10 0504) Pulse Rate:  [66-72] 66 (12/10 0504) Resp:  [19-20] 20 (12/10 0504) BP: (114-147)/(55-74) 114/55 mmHg (12/10 0504) SpO2:  [95 %-99 %] 95 % (12/10 0504) Last BM Date: 05/10/13  Intake/Output from previous day: 12/09 0701 - 12/10 0700 In: 2400 [I.V.:1600; IV Piggyback:800] Out: -  Intake/Output this shift:    PE: Gen:  Alert, NAD, pleasant Abd: Soft, mild tenderness to deep palpation of the LLQ otherwise NT, ND, +BS, no HSM, no abdominal scars noted   Lab Results:   Recent Labs  05/11/13 0520 05/12/13 0510  WBC 11.0* 8.7  HGB 12.3* 12.4*  HCT 38.2* 37.8*  PLT 179 193   BMET  Recent Labs  05/11/13 0520 05/12/13 0510  NA 136 133*  K 3.9 4.0  CL 100 100  CO2 26 23  GLUCOSE 103* 81  BUN 10 12  CREATININE 1.12 1.03  CALCIUM 8.4 8.6   PT/INR No results found for this basename: LABPROT, INR,  in the last 72 hours CMP     Component Value Date/Time   NA 133* 05/12/2013 0510   K 4.0 05/12/2013 0510   CL 100 05/12/2013 0510   CO2 23 05/12/2013 0510   GLUCOSE 81 05/12/2013 0510   BUN 12 05/12/2013 0510   CREATININE 1.03 05/12/2013 0510   CALCIUM 8.6 05/12/2013 0510   PROT 7.4 05/10/2013 0715   ALBUMIN 3.5 05/10/2013 0715   AST 16 05/10/2013 0715   ALT 17 05/10/2013 0715   ALKPHOS 55 05/10/2013 0715   BILITOT 1.6* 05/10/2013 0715   GFRNONAA 79* 05/12/2013 0510   GFRAA >90 05/12/2013 0510   Lipase     Component Value Date/Time   LIPASE 30 05/09/2013 2355       Studies/Results: No results found.  Anti-infectives: Anti-infectives   Start     Dose/Rate Route Frequency  Ordered Stop   05/10/13 1200  piperacillin-tazobactam (ZOSYN) IVPB 3.375 g     3.375 g 12.5 mL/hr over 240 Minutes Intravenous Every 8 hours 05/10/13 0551     05/10/13 0600  piperacillin-tazobactam (ZOSYN) IVPB 3.375 g     3.375 g 100 mL/hr over 30 Minutes Intravenous  Once 05/10/13 0551 05/10/13 0703   05/10/13 0345  metroNIDAZOLE (FLAGYL) IVPB 500 mg     500 mg 100 mL/hr over 60 Minutes Intravenous  Once 05/10/13 0333 05/10/13 0450   05/10/13 0345  ciprofloxacin (CIPRO) IVPB 400 mg     400 mg 200 mL/hr over 60 Minutes Intravenous  Once 05/10/13 0333 05/10/13 0506       Assessment/Plan HD #4 Sigmoid diverticulitis with microperforation and localized peritonitis  Leukocytosis - improved to 8.7, afebrile Tmax 100.5 1. Allow sips/chips for breakfast, and if he does well, advance to clear tray at lunch 2. Hopefully can treat conservatively for now and avoid surgical intervention, consider repeat CT, looks like he's improving with zosyn so will likely stick with it instead of changing to Invanz given normalization of WBC count  3. Follow WBC, repeat tomorrow  4. Ambulate and IS  5. SCD's and lovenox  LOS: 2 days    Aris Georgia 05/12/2013, 7:53 AM Pager: [phone]

## 2013-05-12 NOTE — Progress Notes (Signed)
General Surgery [facility]  Patient seen and examined.  States he feels better.  Taking some po.  Ambulatory.  Abdomen still tender LLQ to exam.  WBC normal today.  Continue to monitor closely.  Tentatively plan repeat CT abdomen either tomorrow or Friday depending on how he looks clinically.  Velora Heckler, MD, [facility] Office: [phone]

## 2013-05-12 NOTE — Progress Notes (Signed)
ANTIBIOTIC CONSULT NOTE - FOLLOW UP  Pharmacy Consult for Zosyn Indication: Diverticulitis  No Known Allergies  Patient Measurements: Height: 5\' 3"  (160 cm) Weight: 161 lb 3.2 oz (73.12 kg) IBW/kg (Calculated) : 56.9   Vital Signs: Temp: 98.8 F (37.1 C) (12/10 0504) Temp src: Oral (12/10 0504) BP: 114/55 mmHg (12/10 0504) Pulse Rate: 66 (12/10 0504) Intake/Output from previous day: 12/09 0701 - 12/10 0700 In: 2400 [I.V.:1600; IV Piggyback:800] Out: -  Intake/Output from this shift:    Labs:  Recent Labs  05/10/13 0715 05/11/13 0520 05/12/13 0510  WBC 12.5* 11.0* 8.7  HGB 13.3 12.3* 12.4*  PLT 165 179 193  CREATININE 1.07 1.12 1.03   Estimated Creatinine Clearance: 71.8 ml/min (by C-G formula based on Cr of 1.03). No results found for this basename: VANCOTROUGH, VANCOPEAK, VANCORANDOM, GENTTROUGH, GENTPEAK, GENTRANDOM, TOBRATROUGH, TOBRAPEAK, TOBRARND, AMIKACINPEAK, AMIKACINTROU, AMIKACIN,  in the last 72 hours   Microbiology: No results found for this or any previous visit (from the past 720 hour(s)).  Anti-infectives   Start     Dose/Rate Route Frequency Ordered Stop   05/10/13 1200  piperacillin-tazobactam (ZOSYN) IVPB 3.375 g     3.375 g 12.5 mL/hr over 240 Minutes Intravenous Every 8 hours 05/10/13 0551     05/10/13 0600  piperacillin-tazobactam (ZOSYN) IVPB 3.375 g     3.375 g 100 mL/hr over 30 Minutes Intravenous  Once 05/10/13 0551 05/10/13 0703   05/10/13 0345  metroNIDAZOLE (FLAGYL) IVPB 500 mg     500 mg 100 mL/hr over 60 Minutes Intravenous  Once 05/10/13 0333 05/10/13 0450   05/10/13 0345  ciprofloxacin (CIPRO) IVPB 400 mg     400 mg 200 mL/hr over 60 Minutes Intravenous  Once 05/10/13 1610 05/10/13 0506      Assessment: 57 YOM admitted with LLQ pain and CT revealing for diverticulitis. Attempting to avoid surgical intervention. Continues on Zosyn- WBC improved to 8.7, Tmax in 24 hours 100.5 (improved). Renal function has been stable- SCr  1.03, est CrCl ~71mL/min. No cultures.  Goal of Therapy:  Eradication of infection  Plan:  1. Continue Zosyn 3.375gm IV q8h EI 2. Follow for clinical progression, tolerating diet, LOT, renal function  Stavroula Rohde D. Dorrance Sellick, PharmD, BCPS Clinical Pharmacist Pager: [phone] 05/12/2013 11:25 AM

## 2013-05-13 LAB — CBC
HCT: 39.3 % (ref 39.0–52.0)
Hemoglobin: 12.9 g/dL — ABNORMAL LOW (ref 13.0–17.0)
MCV: 79.6 fL (ref 78.0–100.0)
Platelets: 232 10*3/uL (ref 150–400)
RBC: 4.94 MIL/uL (ref 4.22–5.81)
RDW: 13.6 % (ref 11.5–15.5)
WBC: 6.9 10*3/uL (ref 4.0–10.5)

## 2013-05-13 NOTE — Plan of Care (Signed)
Problem: Food- and Nutrition-Related Knowledge Deficit (NB-1.1) Goal: Nutrition education Formal process to instruct or train a patient/client in a skill or to impart knowledge to help patients/clients voluntarily manage or modify food choices and eating behavior to maintain or improve health. Outcome: Progressing Nutrition Education Note  RD consulted for nutrition education regarding Nutrition Management for Diverticulosis/Diverticulitis.   RD provided both "Low Fiber Nutrition Therapy" and "High Fiber Nutrition Therapy" handout from the Academy of [employer]. Reviewed home diet with pt and suggested ways to meet nutrition goals over the next several weeks. Explained reasons to follow Low Fiber diet for the next 4-6 weeks and discussed ways to achieve.  Discussed best practice for long-term management of diverticulosis is a High Fiber diet and discussed ways to increase fiber content in an appropriate time frame. Encouraged fresh fruits and vegetables as well as whole grain sources of carbohydrates to maximize fiber intake.  Encouraged fluid intake. Discussed symptom management should abdominal pain occur while on high fiber diet.  Pt verbalizes understanding of information provided.  Expect good compliance.  Body mass index is 31.04 kg/(m^2). Pt meets criteria for obese, class 1, based on current BMI.  Current diet order is full liquids, patient is consuming approximately 50% of meals at this time. Labs and medications reviewed. No further nutrition interventions warranted at this time. RD contact information provided. If additional nutrition issues arise, please re-consult RD.  Loyce Dys, MS RD LDN Clinical Inpatient Dietitian Pager: [phone] Weekend/After hours pager: [phone]

## 2013-05-13 NOTE — Progress Notes (Signed)
Subjective: Pt much improved, still mild pain in LLQ, but using pain medication much less today.  Tolerated clears and wants to advance.  Ambulating well.  Urinating and having BM's.  Wife at bedside says she will make him some puree rice/pourage and bring it to him since he doesn't like the [place] food.  Objective: Vital signs in last 24 hours: Temp:  [98.5 F (36.9 C)-98.7 F (37.1 C)] 98.5 F (36.9 C) (12/11 0518) Pulse Rate:  [63-65] 63 (12/11 0518) Resp:  [18] 18 (12/11 0518) BP: (107-123)/(67-72) 107/67 mmHg (12/11 0518) SpO2:  [98 %-99 %] 99 % (12/11 0518) Last BM Date: 05/12/13  Intake/Output from previous day: 12/10 0701 - 12/11 0700 In: 2700 [I.V.:2500; IV Piggyback:200] Out: -  Intake/Output this shift:    PE: Gen:  Alert, NAD, pleasant Abd: Soft, minimal tenderness in LLQ, ND, +BS, no HSM, no abdominal scars noted   Lab Results:   Recent Labs  05/12/13 0510 05/13/13 0433  WBC 8.7 6.9  HGB 12.4* 12.9*  HCT 37.8* 39.3  PLT 193 232   BMET  Recent Labs  05/11/13 0520 05/12/13 0510  NA 136 133*  K 3.9 4.0  CL 100 100  CO2 26 23  GLUCOSE 103* 81  BUN 10 12  CREATININE 1.12 1.03  CALCIUM 8.4 8.6   PT/INR No results found for this basename: LABPROT, INR,  in the last 72 hours CMP     Component Value Date/Time   NA 133* 05/12/2013 0510   K 4.0 05/12/2013 0510   CL 100 05/12/2013 0510   CO2 23 05/12/2013 0510   GLUCOSE 81 05/12/2013 0510   BUN 12 05/12/2013 0510   CREATININE 1.03 05/12/2013 0510   CALCIUM 8.6 05/12/2013 0510   PROT 7.4 05/10/2013 0715   ALBUMIN 3.5 05/10/2013 0715   AST 16 05/10/2013 0715   ALT 17 05/10/2013 0715   ALKPHOS 55 05/10/2013 0715   BILITOT 1.6* 05/10/2013 0715   GFRNONAA 79* 05/12/2013 0510   GFRAA >90 05/12/2013 0510   Lipase     Component Value Date/Time   LIPASE 30 05/09/2013 2355       Studies/Results: No results found.  Anti-infectives: Anti-infectives   Start     Dose/Rate Route Frequency  Ordered Stop   05/10/13 1200  piperacillin-tazobactam (ZOSYN) IVPB 3.375 g     3.375 g 12.5 mL/hr over 240 Minutes Intravenous Every 8 hours 05/10/13 0551     05/10/13 0600  piperacillin-tazobactam (ZOSYN) IVPB 3.375 g     3.375 g 100 mL/hr over 30 Minutes Intravenous  Once 05/10/13 0551 05/10/13 0703   05/10/13 0345  metroNIDAZOLE (FLAGYL) IVPB 500 mg     500 mg 100 mL/hr over 60 Minutes Intravenous  Once 05/10/13 0333 05/10/13 0450   05/10/13 0345  ciprofloxacin (CIPRO) IVPB 400 mg     400 mg 200 mL/hr over 60 Minutes Intravenous  Once 05/10/13 0333 05/10/13 0506       Assessment/Plan HD #5 Sigmoid diverticulitis with microperforation and localized peritonitis  Leukocytosis - normal at 6.9, afebrile Tmax 97.8*F 1. Advance to fulls today and low residue tomorrow am 2. Hopefully can treat conservatively for now and avoid surgical intervention, consider repeat CT, looks like he's improving with zosyn so will likely stick with it instead of changing to Invanz given normalization of WBC count  3. Follow WBC, repeat tomorrow  4. Ambulate and IS  5. SCD's and lovenox  6. Possible CT scan tomorrow am, Hopefully d/c tomorrow if  tolerating low residue diet     LOS: 3 days    DORT, Aundra Millet 05/13/2013, 7:44 AM Pager: [phone]

## 2013-05-13 NOTE — Progress Notes (Signed)
General Surgery [facility]  Patient seen and examined.  Clinically improving.  Will plan follow up CT abdomen tomorrow.  Velora Heckler, MD, [facility] Office: [phone]

## 2013-05-14 ENCOUNTER — Telehealth (INDEPENDENT_AMBULATORY_CARE_PROVIDER_SITE_OTHER): Payer: Self-pay

## 2013-05-14 ENCOUNTER — Inpatient Hospital Stay (HOSPITAL_COMMUNITY): Payer: Self-pay

## 2013-05-14 ENCOUNTER — Other Ambulatory Visit (INDEPENDENT_AMBULATORY_CARE_PROVIDER_SITE_OTHER): Payer: Self-pay | Admitting: *Deleted

## 2013-05-14 DIAGNOSIS — K5732 Diverticulitis of large intestine without perforation or abscess without bleeding: Secondary | ICD-10-CM

## 2013-05-14 LAB — CBC
HCT: 40.1 % (ref 39.0–52.0)
Hemoglobin: 13.1 g/dL (ref 13.0–17.0)
MCHC: 32.7 g/dL (ref 30.0–36.0)
MCV: 79.6 fL (ref 78.0–100.0)
RBC: 5.04 MIL/uL (ref 4.22–5.81)
RDW: 13.5 % (ref 11.5–15.5)
WBC: 7.7 10*3/uL (ref 4.0–10.5)

## 2013-05-14 MED ORDER — IOHEXOL 300 MG/ML  SOLN
100.0000 mL | Freq: Once | INTRAMUSCULAR | Status: AC | PRN
  Administered 2013-05-14: 100 mL via INTRAVENOUS

## 2013-05-14 MED ORDER — IOHEXOL 300 MG/ML  SOLN
25.0000 mL | INTRAMUSCULAR | Status: AC
  Administered 2013-05-14 (×2): 25 mL via ORAL

## 2013-05-14 MED ORDER — HYDROCODONE-ACETAMINOPHEN 5-325 MG PO TABS: 1.0000 | ORAL_TABLET | ORAL | Status: DC | PRN

## 2013-05-14 MED ORDER — AMOXICILLIN-POT CLAVULANATE 875-125 MG PO TABS
1.0000 | ORAL_TABLET | Freq: Two times a day (BID) | ORAL | Status: DC
  Administered 2013-05-14: 1 via ORAL
  Filled 2013-05-14: qty 1

## 2013-05-14 MED ORDER — AMOXICILLIN-POT CLAVULANATE 875-125 MG PO TABS: 1.0000 | ORAL_TABLET | Freq: Two times a day (BID) | ORAL | Status: DC

## 2013-05-14 MED ORDER — HYDROCODONE-ACETAMINOPHEN 5-325 MG PO TABS: 1.0000 | ORAL_TABLET | Freq: Four times a day (QID) | ORAL | Status: DC | PRN

## 2013-05-14 NOTE — Care Management Note (Signed)
  Page 1 of 1   05/14/2013     10:54:53 AM   CARE MANAGEMENT NOTE 05/14/2013  Patient:  Jonathan Boyer, Jonathan Boyer   Account Number:  [id number]  Date Initiated:  05/14/2013  Documentation initiated by:  Ronny Flurry  Subjective/Objective Assessment:     Action/Plan:   Anticipated DC Date:  05/14/2013   Anticipated DC Plan:  HOME/SELF CARE      DC Planning Services  MATCH Program      Choice offered to / List presented to:             Status of service:   Medicare Important Message given?   (If response is "NO", the following Medicare IM given date fields will be blank) Date Medicare IM given:   Date Additional Medicare IM given:    Discharge Disposition:    Per UR Regulation:    If discussed at Long Length of Stay Meetings, dates discussed:    Comments:  05-14-13 [facility] letter given and explained to patient's wife who voiced understanding . Explained pain medication would not be covered by [facility] and antibiotic will coat $3 .  Patinet currently does not have PCP . Contact information for [facility] given . Patinet's wife states she will make an appointment for patient.  Ronny Flurry RN BSN [phone]

## 2013-05-14 NOTE — Discharge Summary (Signed)
  Physician Discharge Summary  Patient ID: Jonathan Boyer MRN: [id number] DOB/AGE: 07-12-1955 57 y.o.  Admit date: 05/10/2013 Discharge date: 05/14/2013  Admitting Diagnosis: Diverticulitis with microperferation and localized peritonitis Leukocytosis LLQ abdominal pain  Discharge Diagnosis Patient Active Problem List   Diagnosis Date Noted  . Diverticulitis of colon with perforation 05/10/2013  . Diverticulitis 05/10/2013    Consultants Dr. Toniann Fail ([facility])  Imaging: No results found.  Procedures None  Hospital Course:  57 y/o [place] male who presented to [facility] with 1 day hx of LLQ abdominal pain sharp non radiating (on 05/10/13).  He left work since his pain got worse. Nausea, no appetite. Pain worsening. Hurts to move. No diarrhea or vomiting. He tried ibuprofen and milk of magnesia without relief.  Workup showed sigmoid diverticulitis with microperforation and localized peritonitis and leukocytosis.  This was his first episode of diverticulitis.  Patient was admitted for IV antibiotics, conservative management and pain control.  His pain improved by HD #2 and he was trailed on clears.  After eating dinner on HD #2 his pain returned thus he was made NPO on HD #3.  Over the next several days his WBC improved and so did his pain.  Diet was advanced as tolerated.  We consulted the dietitian for diet education.  Before discharge a CT scan was obtained which showed 2 small fluid collections consistent with an abscess.  However, because his clinical picture is significantly improved and the patient would prefer to continue antibiotic therapy at home.  He understands he may have to return to the hospital if his pain worsens, he develops fever/chills etc.  On HD #5, the patient was voiding well, tolerating diet, ambulating well, pain well controlled, vital signs stable, and felt stable for discharge home.  He will have a follow up CT scan in 10 days which will be arranged by  our office.  Patient will follow up in our office in 2 weeks and knows to call with questions or concerns.  He will continue 2 additional weeks of Augmentin and is encouraged to take a probiotic to help prevent diarrhea.        Medication List    STOP taking these medications       ibuprofen 200 MG tablet  Commonly known as:  ADVIL,MOTRIN     magnesium hydroxide 400 MG/5ML suspension  Commonly known as:  MILK OF MAGNESIA      TAKE these medications       amoxicillin-clavulanate 875-125 MG per tablet  Commonly known as:  AUGMENTIN  Take 1 tablet by mouth every 12 (twelve) hours.     HYDROcodone-acetaminophen 5-325 MG per tablet  Commonly known as:  NORCO/VICODIN  Take 1-2 tablets by mouth every 6 (six) hours as needed for moderate pain or severe pain.             Follow-up Information   Follow up with Velora Heckler, MD On 05/31/2013. (Your appointment is at 11:00am, please arrive at least 30 minutes before your appointment to complete your check in paperwork.  If you are unable to arrive 30 minutes prior to your appointment time we may have to cancel or reschedule you.)    Specialty:  General Surgery   Contact information:   [address] [place] [address] [phone]       Signed: Candiss Norse [facility] [phone]  05/14/2013, 9:23 AM

## 2013-05-14 NOTE — Discharge Summary (Signed)
General Surgery [facility]  Patient seen and examined.  See today's progress notes.  Agree with discharge summary.  Will see in office after follow up CT scan in 10 days.  Velora Heckler, MD, [facility] Office: [phone]

## 2013-05-14 NOTE — Progress Notes (Signed)
  Subjective: Pt continued to improvement in pain.  WBC has been normal for 3-4 days and afebrile.  Ambulating well.  +flatus, no BM yesterday or today yet.  Urinating well.  Tolerating full liquids.    Objective: Vital signs in last 24 hours: Temp:  [98.3 F (36.8 C)-98.7 F (37.1 C)] 98.3 F (36.8 C) (12/12 0530) Pulse Rate:  [59-69] 69 (12/12 0530) Resp:  [18] 18 (12/12 0530) BP: (111-120)/(72-73) 111/73 mmHg (12/12 0530) SpO2:  [97 %-99 %] 99 % (12/12 0530) Last BM Date: 05/12/13  Intake/Output from previous day: 12/11 0701 - 12/12 0700 In: 990 [P.O.:840; IV Piggyback:150] Out: -  Intake/Output this shift:    PE: Gen:  Alert, NAD, pleasant Abd: Soft, mild tenderness in LLQ, +BS, no HSM, no abdominal scars noted   Lab Results:   Recent Labs  05/13/13 0433 05/14/13 0540  WBC 6.9 7.7  HGB 12.9* 13.1  HCT 39.3 40.1  PLT 232 242   BMET  Recent Labs  05/12/13 0510  NA 133*  K 4.0  CL 100  CO2 23  GLUCOSE 81  BUN 12  CREATININE 1.03  CALCIUM 8.6   PT/INR No results found for this basename: LABPROT, INR,  in the last 72 hours CMP     Component Value Date/Time   NA 133* 05/12/2013 0510   K 4.0 05/12/2013 0510   CL 100 05/12/2013 0510   CO2 23 05/12/2013 0510   GLUCOSE 81 05/12/2013 0510   BUN 12 05/12/2013 0510   CREATININE 1.03 05/12/2013 0510   CALCIUM 8.6 05/12/2013 0510   PROT 7.4 05/10/2013 0715   ALBUMIN 3.5 05/10/2013 0715   AST 16 05/10/2013 0715   ALT 17 05/10/2013 0715   ALKPHOS 55 05/10/2013 0715   BILITOT 1.6* 05/10/2013 0715   GFRNONAA 79* 05/12/2013 0510   GFRAA >90 05/12/2013 0510   Lipase     Component Value Date/Time   LIPASE 30 05/09/2013 2355       Studies/Results: No results found.  Anti-infectives: Anti-infectives   Start     Dose/Rate Route Frequency Ordered Stop   05/10/13 1200  piperacillin-tazobactam (ZOSYN) IVPB 3.375 g     3.375 g 12.5 mL/hr over 240 Minutes Intravenous Every 8 hours 05/10/13 0551     05/10/13 0600  piperacillin-tazobactam (ZOSYN) IVPB 3.375 g     3.375 g 100 mL/hr over 30 Minutes Intravenous  Once 05/10/13 0551 05/10/13 0703   05/10/13 0345  metroNIDAZOLE (FLAGYL) IVPB 500 mg     500 mg 100 mL/hr over 60 Minutes Intravenous  Once 05/10/13 0333 05/10/13 0450   05/10/13 0345  ciprofloxacin (CIPRO) IVPB 400 mg     400 mg 200 mL/hr over 60 Minutes Intravenous  Once 05/10/13 0333 05/10/13 0506       Assessment/Plan HD #6 Sigmoid diverticulitis with microperforation and localized peritonitis  Leukocytosis - normal at 7.7, afebrile Tmax 97.7*F  1. NPO for CT scan, but advancing to low residue diet today 2. Continue antibiotic, switch to Augmentin today from Zosyn to see if he can tolerate it 3. CT scan to check for improvement in diverticulitis and resolution of microperforation/localized peritonitis 4. Ambulate and IS  5. SCD's and lovenox  6. Hopefully d/c today if CT scan improved and tolerating low residue diet     LOS: 4 days    DORT, Jayde Daffin 05/14/2013, 8:01 AM Pager: [phone]

## 2013-05-14 NOTE — Telephone Encounter (Signed)
Per [facility] [place] appt made with Dr Gerrit Friends for 12-29 for f/u. Daughter notified of date and time.

## 2013-05-14 NOTE — Progress Notes (Signed)
Discharge home. Home discharge instruction given to patient, no questions verbalized. 

## 2013-05-14 NOTE — Progress Notes (Signed)
General Surgery [facility]  Patient seen and examined.  CT scan results reviewed.  Clinically better, but scan shows two small collections which are too small to drain percutaneously at present.  Will plan to discharge on po abx and low residue diet.  Plan two week course of oral antibiotics.  Will repeat CT scan in 10 days as out-patient and see in [facility] office at 2 weeks.  Patient to call [facility] office or return to ER if any clinical deterioration.  Velora Heckler, MD, [facility] Office: [phone]

## 2013-05-17 ENCOUNTER — Telehealth (INDEPENDENT_AMBULATORY_CARE_PROVIDER_SITE_OTHER): Payer: Self-pay | Admitting: *Deleted

## 2013-05-17 NOTE — Telephone Encounter (Signed)
LMOM for pt to return my call.  I was calling to notify him of his appt for the CT scan on GI-301 on 12/22 with an arrival time of 10:45am.  Pt needs to pick up contrast prior to the day before the scan.  Also, pt is to have NO solid foods 4 hours prior to scan.

## 2013-05-24 ENCOUNTER — Telehealth (INDEPENDENT_AMBULATORY_CARE_PROVIDER_SITE_OTHER): Payer: Self-pay | Admitting: *Deleted

## 2013-05-24 ENCOUNTER — Other Ambulatory Visit: Payer: No Typology Code available for payment source

## 2013-05-24 NOTE — Telephone Encounter (Signed)
Pts daughter called and stated that pt forgot to pick up his contrast from GI for his CT scan scheduled at 11:00am today.  I informed her that he would need to call and reschedule and would need to pick up the contrast at least one day prior to scan.  I provided her with their phone number.  She is agreeable with this information and will call to reschedule.

## 2013-05-31 ENCOUNTER — Encounter (INDEPENDENT_AMBULATORY_CARE_PROVIDER_SITE_OTHER): Payer: Self-pay | Admitting: Surgery

## 2013-05-31 ENCOUNTER — Telehealth (INDEPENDENT_AMBULATORY_CARE_PROVIDER_SITE_OTHER): Payer: Self-pay | Admitting: Surgery

## 2013-05-31 ENCOUNTER — Inpatient Hospital Stay (HOSPITAL_COMMUNITY): Admission: AD | Admit: 2013-05-31 | Payer: MEDICAID | Source: Ambulatory Visit | Admitting: General Surgery

## 2013-05-31 ENCOUNTER — Ambulatory Visit
Admission: RE | Admit: 2013-05-31 | Discharge: 2013-05-31 | Disposition: A | Discharge status: Home / Self Care | Payer: No Typology Code available for payment source | Source: Ambulatory Visit | Attending: Surgery | Admitting: Surgery

## 2013-05-31 DIAGNOSIS — K5732 Diverticulitis of large intestine without perforation or abscess without bleeding: Secondary | ICD-10-CM

## 2013-05-31 DIAGNOSIS — K5720 Diverticulitis of large intestine with perforation and abscess without bleeding: Secondary | ICD-10-CM

## 2013-05-31 MED ORDER — IOHEXOL 300 MG/ML  SOLN
100.0000 mL | Freq: Once | INTRAMUSCULAR | Status: AC | PRN
  Administered 2013-05-31: 100 mL via INTRAVENOUS

## 2013-05-31 NOTE — Telephone Encounter (Signed)
Discussed CT scan results from this morning with Dr. Constance Goltz.  Patient has likely failed medical management of perforated diverticulitis and will require surgery.  [facility] service is very busy - discussed with Dr. Corliss Skains.  Will ask [facility] service to admit.  Discussed with Emina Riebock and left message for Dr. Maisie Fus.  Anette Riedel will try to contact patient and coordinate admission today.  Velora Heckler, MD, [facility] Office: [phone]

## 2013-06-01 ENCOUNTER — Telehealth (INDEPENDENT_AMBULATORY_CARE_PROVIDER_SITE_OTHER): Payer: Self-pay | Admitting: Surgery

## 2013-06-01 ENCOUNTER — Inpatient Hospital Stay (HOSPITAL_COMMUNITY)
Admission: EM | Admit: 2013-06-01 | Discharge: 2013-06-03 | DRG: 392 | Disposition: A | Discharge status: Home / Self Care | Payer: No Typology Code available for payment source | Attending: General Surgery | Admitting: General Surgery

## 2013-06-01 DIAGNOSIS — K5730 Diverticulosis of large intestine without perforation or abscess without bleeding: Secondary | ICD-10-CM

## 2013-06-01 DIAGNOSIS — K5720 Diverticulitis of large intestine with perforation and abscess without bleeding: Secondary | ICD-10-CM | POA: Diagnosis present

## 2013-06-01 DIAGNOSIS — K5790 Diverticulosis of intestine, part unspecified, without perforation or abscess without bleeding: Secondary | ICD-10-CM | POA: Diagnosis present

## 2013-06-01 DIAGNOSIS — K5732 Diverticulitis of large intestine without perforation or abscess without bleeding: Principal | ICD-10-CM | POA: Diagnosis present

## 2013-06-01 HISTORY — DX: Diverticulosis of intestine, part unspecified, without perforation or abscess without bleeding: K57.90

## 2013-06-01 LAB — CBC WITH DIFFERENTIAL/PLATELET
Eosinophils Relative: 2 % (ref 0–5)
Hemoglobin: 13.1 g/dL (ref 13.0–17.0)
Lymphocytes Relative: 33 % (ref 12–46)
Lymphs Abs: 1.5 10*3/uL (ref 0.7–4.0)
MCV: 79.3 fL (ref 78.0–100.0)
Monocytes Relative: 8 % (ref 3–12)
Neutrophils Relative %: 56 % (ref 43–77)
Platelets: 219 10*3/uL (ref 150–400)
RBC: 5.21 MIL/uL (ref 4.22–5.81)
RDW: 14.1 % (ref 11.5–15.5)
WBC: 4.7 10*3/uL (ref 4.0–10.5)

## 2013-06-01 LAB — BASIC METABOLIC PANEL
BUN: 12 mg/dL (ref 6–23)
CO2: 27 mEq/L (ref 19–32)
Glucose, Bld: 104 mg/dL — ABNORMAL HIGH (ref 70–99)
Potassium: 4 mEq/L (ref 3.7–5.3)
Sodium: 138 mEq/L (ref 137–147)

## 2013-06-01 MED ORDER — ONDANSETRON HCL 4 MG/2ML IJ SOLN: 4.0000 mg | Freq: Four times a day (QID) | INTRAMUSCULAR | Status: DC | PRN

## 2013-06-01 MED ORDER — MORPHINE SULFATE 2 MG/ML IJ SOLN: 2.0000 mg | INTRAMUSCULAR | Status: DC | PRN

## 2013-06-01 MED ORDER — DIPHENHYDRAMINE HCL 50 MG/ML IJ SOLN: 12.5000 mg | Freq: Four times a day (QID) | INTRAMUSCULAR | Status: DC | PRN

## 2013-06-01 MED ORDER — SODIUM CHLORIDE 0.9 % IV SOLN
Freq: Once | INTRAVENOUS | Status: AC
  Administered 2013-06-01: 22:00:00 via INTRAVENOUS

## 2013-06-01 MED ORDER — DOCUSATE SODIUM 100 MG PO CAPS
100.0000 mg | ORAL_CAPSULE | Freq: Two times a day (BID) | ORAL | Status: DC
  Administered 2013-06-02 (×2): 100 mg via ORAL
  Filled 2013-06-01 (×5): qty 1

## 2013-06-01 MED ORDER — SODIUM CHLORIDE 0.9 % IV SOLN
1.0000 g | Freq: Every day | INTRAVENOUS | Status: DC
  Administered 2013-06-01 – 2013-06-02 (×2): 1 g via INTRAVENOUS
  Filled 2013-06-01 (×2): qty 1

## 2013-06-01 MED ORDER — DIPHENHYDRAMINE HCL 12.5 MG/5ML PO ELIX: 12.5000 mg | ORAL_SOLUTION | Freq: Four times a day (QID) | ORAL | Status: DC | PRN

## 2013-06-01 NOTE — ED Notes (Signed)
Patient reports to ED from PCP for CT scan showing diverticulitis. Patient denies pain at moment, c/o intermittent left sided abdominal pain. Reports that bowel movements are regular.

## 2013-06-01 NOTE — Progress Notes (Signed)
Patient agreed to be transferred to [facility]. Team to be determined by flow manager

## 2013-06-01 NOTE — ED Notes (Signed)
MD at bedside. 

## 2013-06-01 NOTE — ED Notes (Signed)
Surgeon at bedside.  

## 2013-06-01 NOTE — Telephone Encounter (Signed)
Pt's daughter, Ms. Ricki Miller, is calling our office today from [facility] admitting concerned because there is no available bed.  The pt refused to go to the hospital yesterday when directly admitted by Dr. Gerrit Friends. The pt does not want to wait in the ED to be admitted today.  He says he is "feeling fine". They are requesting to see Dr. Gerrit Friends again and be admitted directly. Dr. Gerrit Friends has refused. Dr. Ardine Eng nurse spoke with the pt's daughter and emphatically stressed the urgency of pt waiting to be admitted today so he can be treated.  The daughter states she will try to convince her parents to stay at the hospital.

## 2013-06-01 NOTE — H&P (Signed)
Jonathan Boyer is an 57 y.o. male.   Chief Complaint: abd pain HPI: Patient has been followed outpatient for a diverticular abscess.  He was previously admitted on IV antibiotics. He is now a course of oral antibiotics. A followup CT scan showed some increasing localized pneumoperitoneum and he was asked to present to the emergency department for evaluation. Patient states that his pain is getting better. He was having regular bowel movements. He denies any fever, nausea, vomiting or diarrhea. He is back to work and is doing fine with this.  Past Medical History  Diagnosis Date  . Medical history non-contributory     Past Surgical History  Procedure Laterality Date  . No past surgeries      Family History  Problem Relation Age of Onset  . Cancer Mother    Social History:  reports that he has never smoked. He does not have any smokeless tobacco history on file. He reports that he drinks alcohol. He reports that he does not use illicit drugs.  Allergies: No Known Allergies   (Not in a hospital admission)  No results found for this or any previous visit (from the past 48 hour(s)). Ct Abdomen Pelvis W Contrast  05/31/2013   CLINICAL DATA:  Perforated diverticulitis with fluid collection for 3 weeks. Some left lower quadrant pain. Discomfort continues. Occasional diarrhea. Patient feels much better. Finished antibiotics 2 days ago.  EXAM: CT ABDOMEN AND PELVIS WITH CONTRAST  TECHNIQUE: Multidetector CT imaging of the abdomen and pelvis was performed using the standard protocol following bolus administration of intravenous contrast.  CONTRAST:  OMNIPAQUE IOHEXOL 300 MG/ML  SOLN  COMPARISON:  05/14/2013 and 05/10/2013.  FINDINGS: Sigmoid and descending colon diverticula. Anterior to the junction of the descending colon and sigmoid colon, loculated free air. This is at the level where patient previously was noted to have small abscesses. Additionally, there tiny locules of free air within the  upper abdomen. Findings suggestive persistent perforation of descending colon/ sigmoid colon diverticula/diverticulitis.  No well-defined drainable abscess collection currently detected.  Lung bases clear.  Cardiomegaly. Very mild atherosclerotic type changes of the lower abdominal aorta. No abdominal aortic aneurysm.  No focal hepatic, splenic, pancreatic, renal or adrenal lesion. No calcified gallstones. No adenopathy. No bony destructive lesion.  Non contrast filled views of the urinary bladder unremarkable. Prostate gland top-normal.  IMPRESSION: Sigmoid and descending colon diverticula/diverticulitis. Anterior to the junction of the descending colon and sigmoid colon, loculated free air. This is at the level where patient previously was noted to have small abscesses. Additionally, there tiny locules of free air within the upper abdomen. Findings suggestive persistent perforation of descending colon/ sigmoid colon diverticula/diverticulitis.  No well-defined drainable abscess collection currently detected.  Cardiomegaly.  These results were called by telephone at the time of interpretation on 05/31/2013 at 9:18 AM to Dr. Darnell Level , who verbally acknowledged these results.   Electronically Signed   By: Bridgett Larsson M.D.   On: 05/31/2013 09:34    Review of Systems  Constitutional: Positive for malaise/fatigue. Negative for fever and chills.  Respiratory: Negative for cough and shortness of breath.   Cardiovascular: Negative for chest pain.  Gastrointestinal: Positive for abdominal pain. Negative for nausea, vomiting, diarrhea, constipation and melena.       Vague LLQ pain  Genitourinary: Negative for dysuria, urgency and frequency.  Neurological: Negative for weakness and headaches.    There were no vitals taken for this visit. Physical Exam  Constitutional: He is oriented  to person, place, and time. He appears well-developed and well-nourished.  HENT:  Head: Normocephalic and atraumatic.   Eyes: Pupils are equal, round, and reactive to light.  Neck: Normal range of motion.  Cardiovascular: Normal rate and regular rhythm.   Respiratory: Effort normal and breath sounds normal.  GI: Soft. Bowel sounds are normal. He exhibits no distension and no mass. There is tenderness. There is no rebound and no guarding.  Mild LLQ tenderness at ASIS  Musculoskeletal: Normal range of motion.  Neurological: He is alert and oriented to person, place, and time.  Skin: Skin is warm and dry.     Assessment/Plan Given these new CT findings I think is appropriate to place him in the hospital on IV antibiotics. We will monitor him for 48 hours. If his lab work and symptoms did not get any worse transition him back to by mouth antibiotics and a regular diet. Hopefully we can continue to monitor this and treated nonoperatively. He and a colon resection in approximately 3 months to prevent this from occurring in the future.  Aberdeen Hafen C. 06/01/2013, 6:14 PM

## 2013-06-01 NOTE — Telephone Encounter (Signed)
I spoke with the patient's daughter on the telephone today. We have been trying for the past 2 days to reach the patient and bring him to the hospital for admission for treatment of perforated diverticulitis. I discussed this with the patient's daughter. We reviewed his CT scan results. I feel the patient will need intravenous antibiotics and probable surgical resection for management of perforated diverticulitis. He is recent CT scan shows that the disease has not responded to prolonged antibiotic treatment. The patient has orders for admission waiting at [facility]. Our surgeon on duty there is expecting him for admission.  The patient's daughter will try to convince him to come to the hospital today.  The patient's daughter's name is Vevelyn Francois.  She works at [employer].  Her phone number is [phone].  Velora Heckler, MD, [facility] Office: [phone]

## 2013-06-01 NOTE — Progress Notes (Signed)
Utilization Review completed.  Dillyn Joaquin RN CM  

## 2013-06-01 NOTE — ED Provider Notes (Signed)
CSN: [id number]     Arrival date & time 06/01/13  1707 History   First MD Initiated Contact with Patient 06/01/13 1722     No chief complaint on file.  HPI  Patient presents with abdominal pain.  Notably, the patient has history of diverticulitis within the past month. He notes that in general he is feeling better, but he continues to have persistent soreness focally about the left lower quadrant. With his persistent discomfort he had a CT scan performed yesterday.  He was advised to be admitted, but deferred that request until today. The soreness is nonradiating, and there is no no associated fever, chills, vomiting, diarrhea. Patient states that he took all medication as directed.   Past Medical History  Diagnosis Date  . Medical history non-contributory    Past Surgical History  Procedure Laterality Date  . No past surgeries     Family History  Problem Relation Age of Onset  . Cancer Mother    History  Substance Use Topics  . Smoking status: Never Smoker   . Smokeless tobacco: Not on file  . Alcohol Use: Yes     Comment: occasional    Review of Systems  Constitutional:       Per HPI, otherwise negative  HENT:       Per HPI, otherwise negative  Respiratory:       Per HPI, otherwise negative  Cardiovascular:       Per HPI, otherwise negative  Gastrointestinal: Negative for vomiting.  Endocrine:       Negative aside from HPI  Genitourinary:       Neg aside from HPI   Musculoskeletal:       Per HPI, otherwise negative  Skin: Negative.   Neurological: Negative for syncope.    Allergies  Review of patient's allergies indicates no known allergies.  Home Medications   Current Outpatient Rx  Name  Route  Sig  Dispense  Refill  . amoxicillin-clavulanate (AUGMENTIN) 875-125 MG per tablet   Oral   Take 1 tablet by mouth every 12 (twelve) hours.   28 tablet   0   . HYDROcodone-acetaminophen (NORCO/VICODIN) 5-325 MG per tablet   Oral   Take 1-2 tablets by  mouth every 6 (six) hours as needed for moderate pain or severe pain.   40 tablet   0    There were no vitals taken for this visit. Physical Exam  Nursing note and vitals reviewed. Constitutional: He is oriented to person, place, and time. He appears well-developed. No distress.  HENT:  Head: Normocephalic and atraumatic.  Eyes: Conjunctivae and EOM are normal.  Cardiovascular: Normal rate and regular rhythm.   Pulmonary/Chest: Effort normal. No stridor. No respiratory distress.  Abdominal: He exhibits no distension.    Musculoskeletal: He exhibits no edema.  Neurological: He is alert and oriented to person, place, and time.  Skin: Skin is warm and dry.  Psychiatric: He has a normal mood and affect.    ED Course  Procedures (including critical care time) Labs Review Labs Reviewed  CBC WITH DIFFERENTIAL  BASIC METABOLIC PANEL  BASIC METABOLIC PANEL  CBC   Imaging Review Ct Abdomen Pelvis W Contrast  05/31/2013   CLINICAL DATA:  Perforated diverticulitis with fluid collection for 3 weeks. Some left lower quadrant pain. Discomfort continues. Occasional diarrhea. Patient feels much better. Finished antibiotics 2 days ago.  EXAM: CT ABDOMEN AND PELVIS WITH CONTRAST  TECHNIQUE: Multidetector CT imaging of the abdomen and pelvis was performed  using the standard protocol following bolus administration of intravenous contrast.  CONTRAST:  OMNIPAQUE IOHEXOL 300 MG/ML  SOLN  COMPARISON:  05/14/2013 and 05/10/2013.  FINDINGS: Sigmoid and descending colon diverticula. Anterior to the junction of the descending colon and sigmoid colon, loculated free air. This is at the level where patient previously was noted to have small abscesses. Additionally, there tiny locules of free air within the upper abdomen. Findings suggestive persistent perforation of descending colon/ sigmoid colon diverticula/diverticulitis.  No well-defined drainable abscess collection currently detected.  Lung bases  clear.  Cardiomegaly. Very mild atherosclerotic type changes of the lower abdominal aorta. No abdominal aortic aneurysm.  No focal hepatic, splenic, pancreatic, renal or adrenal lesion. No calcified gallstones. No adenopathy. No bony destructive lesion.  Non contrast filled views of the urinary bladder unremarkable. Prostate gland top-normal.  IMPRESSION: Sigmoid and descending colon diverticula/diverticulitis. Anterior to the junction of the descending colon and sigmoid colon, loculated free air. This is at the level where patient previously was noted to have small abscesses. Additionally, there tiny locules of free air within the upper abdomen. Findings suggestive persistent perforation of descending colon/ sigmoid colon diverticula/diverticulitis.  No well-defined drainable abscess collection currently detected.  Cardiomegaly.  These results were called by telephone at the time of interpretation on 05/31/2013 at 9:18 AM to Dr. Darnell Level , who verbally acknowledged these results.   Electronically Signed   By: Bridgett Larsson M.D.   On: 05/31/2013 09:34    EKG Interpretation   None      During the initial evaluation I reviewed the patient's chart, discussed with him and family members. Then we reviewed the CT scan from yesterday, with findings concerning for pneumoperitoneum.  Subsequently I discussed the patient's case with our surgical team.  Patient will be evaluated and admitted to MDM  No diagnosis found. Patient presents several weeks after an initial episode of diverticulitis, now with abdominal pain.  The patient's hemodynamics stable, generally well-appearing, he hasn't CT evidence of likely perforation with persistent diverticulitis.  Patient was admitted to the surgical team for initiation of antibiotics, further evaluation and management.    Gerhard Munch, MD 06/01/13 [phone]

## 2013-06-02 ENCOUNTER — Encounter (HOSPITAL_COMMUNITY): Payer: Self-pay | Admitting: *Deleted

## 2013-06-02 LAB — BASIC METABOLIC PANEL
BUN: 12 mg/dL (ref 6–23)
Calcium: 9.5 mg/dL (ref 8.4–10.5)
GFR calc Af Amer: >90 mL/min (ref >90)
GFR calc non Af Amer: 82 mL/min — ABNORMAL LOW (ref >90)
Potassium: 4 mEq/L (ref 3.7–5.3)
Sodium: 139 mEq/L (ref 137–147)

## 2013-06-02 LAB — CBC
Hemoglobin: 13.5 g/dL (ref 13.0–17.0)
MCHC: 32.1 g/dL (ref 30.0–36.0)
Platelets: 211 10*3/uL (ref 150–400)
RDW: 14.1 % (ref 11.5–15.5)
WBC: 4.6 10*3/uL (ref 4.0–10.5)

## 2013-06-02 NOTE — Progress Notes (Signed)
Diverticular disease  Subjective: Feels fine this am.  No new issues.  Tolerating clears.    Objective: Vital signs in last 24 hours: Temp:  [98.1 F (36.7 C)-98.3 F (36.8 C)] 98.2 F (36.8 C) (12/31 0758) Pulse Rate:  [50-84] 50 (12/31 0758) Resp:  [16-18] 16 (12/31 0758) BP: (105-135)/(57-73) 108/66 mmHg (12/31 0758) SpO2:  [97 %-99 %] 99 % (12/31 0758)    Intake/Output from previous day:   Intake/Output this shift:    General appearance: alert and cooperative GI: normal findings: soft, slight tenderness in LLQ (unchanged)  Lab Results:  Results for orders placed during the hospital encounter of 06/01/13 (from the past 24 hour(s))  CBC WITH DIFFERENTIAL     Status: Abnormal   Collection Time    06/01/13  5:45 PM      Result Value Range   WBC 4.7  4.0 - 10.5 K/uL   RBC 5.21  4.22 - 5.81 MIL/uL   Hemoglobin 13.1  13.0 - 17.0 g/dL   HCT 81.1  91.4 - 78.2 %   MCV 79.3  78.0 - 100.0 fL   MCH 25.1 (*) 26.0 - 34.0 pg   MCHC 31.7  30.0 - 36.0 g/dL   RDW 95.6  21.3 - 08.6 %   Platelets 219  150 - 400 K/uL   Neutrophils Relative % 56  43 - 77 %   Neutro Abs 2.6  1.7 - 7.7 K/uL   Lymphocytes Relative 33  12 - 46 %   Lymphs Abs 1.5  0.7 - 4.0 K/uL   Monocytes Relative 8  3 - 12 %   Monocytes Absolute 0.4  0.1 - 1.0 K/uL   Eosinophils Relative 2  0 - 5 %   Eosinophils Absolute 0.1  0.0 - 0.7 K/uL   Basophils Relative 1  0 - 1 %   Basophils Absolute 0.0  0.0 - 0.1 K/uL  BASIC METABOLIC PANEL     Status: Abnormal   Collection Time    06/01/13  5:45 PM      Result Value Range   Sodium 138  137 - 147 mEq/L   Potassium 4.0  3.7 - 5.3 mEq/L   Chloride 98  96 - 112 mEq/L   CO2 27  19 - 32 mEq/L   Glucose, Bld 104 (*) 70 - 99 mg/dL   BUN 12  6 - 23 mg/dL   Creatinine, Ser 5.78  0.50 - 1.35 mg/dL   Calcium 46.9  8.4 - 62.9 mg/dL   GFR calc non Af Amer 75 (*) >90 mL/min   GFR calc Af Amer 87 (*) >90 mL/min     Studies/Results Radiology     MEDS, Scheduled .  docusate sodium  100 mg Oral BID     Assessment: Diverticular disease  Plan: Admitted due to worsening of follow up CT for perforated diverticulitis.  Pt appears that he is resolving his diverticulitis.  I will advance to full liquids and cont IV antibiotics.  If no new issues arise in the next 24h, we will d/c home on PO antibiotics.  He will need a sigmoidectomy in the future to prevent this from recurring.     LOS: 1 day    Vanita Panda, MD [facility], [place] [phone]   06/02/2013 8:20 AM

## 2013-06-03 ENCOUNTER — Encounter (HOSPITAL_COMMUNITY): Payer: Self-pay | Admitting: Surgery

## 2013-06-03 DIAGNOSIS — K5730 Diverticulosis of large intestine without perforation or abscess without bleeding: Secondary | ICD-10-CM

## 2013-06-03 LAB — CBC
HCT: 41.9 % (ref 39.0–52.0)
Hemoglobin: 13.3 g/dL (ref 13.0–17.0)
MCH: 25.5 pg — ABNORMAL LOW (ref 26.0–34.0)
MCHC: 31.7 g/dL (ref 30.0–36.0)
MCV: 80.4 fL (ref 78.0–100.0)
Platelets: 202 10*3/uL (ref 150–400)
RBC: 5.21 MIL/uL (ref 4.22–5.81)
RDW: 14 % (ref 11.5–15.5)
WBC: 5 10*3/uL (ref 4.0–10.5)

## 2013-06-03 MED ORDER — METRONIDAZOLE 500 MG PO TABS
500.0000 mg | ORAL_TABLET | Freq: Three times a day (TID) | ORAL | Status: DC
  Filled 2013-06-03 (×3): qty 1

## 2013-06-03 MED ORDER — CIPROFLOXACIN HCL 500 MG PO TABS: 500.0000 mg | ORAL_TABLET | Freq: Two times a day (BID) | ORAL | Status: DC

## 2013-06-03 MED ORDER — CIPROFLOXACIN HCL 500 MG PO TABS
500.0000 mg | ORAL_TABLET | Freq: Two times a day (BID) | ORAL | Status: DC
  Filled 2013-06-03 (×2): qty 1

## 2013-06-03 MED ORDER — METRONIDAZOLE 500 MG PO TABS: 500.0000 mg | ORAL_TABLET | Freq: Three times a day (TID) | ORAL | Status: DC

## 2013-06-03 NOTE — Discharge Instructions (Signed)
Vim Ti Th?a (Diverticulitis) Ti th?a l m?t ti nh? ho?c ti trn ??i trng. Vim ti th?a l s? hi?n di?n c?a ti th?a trn ??i trng. Vim ti th?a l s? kch ?ng (vim) ho?c nhi?m trng c?a ti th?a.  NGUYN NHN ??i trng v cc ti th?a l n?i tr ng? c?a cc vi trng. N?u cc m?nh v?n c?a th?c ?n lm t?c ngh?n nh?ng l? thng nh? c?a ti th?a, vi trng bn trong c th? sinh si v lm t?ng p l?c c?a ti th?a. ?i?u ny d?n ??n nhi?m trng v vim nhi?m v ???c g?i l vim ti th?a. TRI?U CH?NG  ?au v nh?y c?m ?au ? b?ng. Th??ng hay b? ?au ? bn tri b?ng. Tuy nhin, n c th? b? ? ch? khc.  S?t.  ??y h?i.  C?m th?y kh ch?u trong d? dy (bu?n nn).  i (nn m?a).  Phn khng bnh th??ng. CH?N ?ON Chuyn gia ch?m [place] s?c kh?e s? xem xt ti?n s? v khm th?c th?Marland Kitchen. Do c nhi?u nguyn nhn gy ra ?au b?ng, c th? c?n thm cc xt nghi?m khc. Cc xt nghi?m c th? bao g?m:  Xt nghi?m mu.  Xt nghi?m n??c ti?u.  Ch?p X quang b?ng.  Ch?p CT b?ng. ?i khi c?n ph?u thu?t ?? xc ??nh xem c ph?i do vim ti th?a hay cc tnh tr?ng b?nh l khc gy ra cc tri?u ch?ng c?a b?n. ?I?U TR? ?a s? tr??ng h?p, b?n c th? ???c ?i?u tr? m khng c?n ph?u thu?t. Vi?c ?i?u tr? bao g?m:  ?? cho ru?t ngh? ng?i b?ng cch ch? dng cc th?c ?n l?ng trong vi ngy. Khi ?? h?n, b?n s? c?n ?n m?t ch? ?? ?n t ch?t x?.  Cc lo?i d?ch truy?n b?ng ???ng t?nh m?ch (IV) n?u c? th? b? d?ch(m?t n??c).  Thu?c khng sinh ?i?u tr? nhi?m trng c th? ???c cho dng.  Thu?c gi?m ?au v ch?ng bu?n nn n?u c?n thi?t.  Ph?u thu?t n?u ti th?a vim b? v?. H??NG D?N CH?M [place] T?I NH  Th? ch? ?? ?n u?ng ton n??c (canh, ch ho?c n??c trong mi?n l theo s? ch? d?n c?a chuyn gia ch?m [place] y t? c?a b?n). Sau ?, b?n c th? d?n d?n b?t ??u ch? ?? ?n t ch?t x? theo ?? dung n?p c?a c? th?. Ch? ?? ?n t ch?t x? l ch? ?? ?n d??i 10 gram ch?t x?. Ch?n th?c ?n d??i ?y ?? lm gi?m ch?t x? trong ch? ?? ?n:  Bnh m  tr?ng, ng? c?c, g?o v m ?ng.  Tri cy v rau ???c n?u chn ho?c tri cy t??i v rau qu? m?m khng c v?.  Th?t b m?m ???c nghi?n ho?c n?u chn k?, gi?m bng, th?t b, th?t c?u, th?t l?n, gia c?m.  Tr?ng v h?i s?n.  Sau khi cc tri?u ch?ng vim ti th?a ? ???c c?i thi?n, chuyn gia ch?m [place] s?c kh?e c?a b?n c th? chuy?n b?n sang ch? ?? ?n nhi?u ch?t x?. Ch? ?? ?n nhi?u ch?t x? bao g?m 14 gam ch?t x? cho m?i 1.000 calo tiu th?. ??i v?i m?t ch? ?? ?n 2000 calo tiu chu?n, b?n s? c?n 28 gam ch?t x?. Th?c hi?n theo cc h??ng d?n ch? ?? ?n ny ?? gip b?n t?ng c??ng ch?t x? trong ch? ?? ?n c?a mnh. ?i?u quan tr?ng l t? t? t?ng l??ng ch?t x? trong ch? ?? ?n c?a b?n ?? trnh h?i, to bn  v ??y h?i.  Ch?n bnh m nguyn h?t, ng? c?c, m ?ng v g?o nu.  Ch?n tri cy v rau t??i cn v?. Khng n?u rau qu chn v n?u rau cng lu th cng m?t nhi?u ch?t x?.  Ch?n nhi?u lo?i c? qu?, h?t, ??u H Lan kh v ??u l?ng.  Hy tm nh?ng s?n ph?m th?c ph?m c trn 3 gam ch?t x? m?i kh?u ph?n trn nhn Thnh Ph?n Dinh D??ng.  S? d?ng t?t c? cc lo?i thu?c theo ch? d?n c?a chuyn gia ch?m [place] s?c kh?e.  N?u chuyn gia ch?m [place] s?c kh?e c?a b?n h?n khm l?i, ?i?u r?t quan tr?ng l b?n ph?i khm l?i. Vi?c khng ?i khm l?i c th? d?n ??n t?n th??ng, ?au, tn t?t v?nh vi?n (m?n tnh). N?u c b?t k? v?n ?? no v?i vi?c tun th? cu?c h?n, hy g?i ?i?n ?? s?p x?p l?i cu?c h?n. ?I KHM B?NH N?U:  ?au khng ??.  B?n kh t?ng ch? ?? ?n qu lo?i th?c ?n l?ng ton n??c.  Nhu ??ng ru?t khng tr? v? bnh th??ng. ?I KHM NGAY L?P T?C N?U:  C?n ?au tr? nn nghim tr?ng h?n.  Nhi?t ?? ?o ? mi?ng trn 38,9 C (102 F), khng gi?m sau khi dng thu?c.  i m?a lin t?c.  B?n ?i ngoi phn c mu, mu ?en ho?c nh? h?c n.  N?u cc tri?u ch?ng m ? khi?n b?n ph?i ?i khm tr? nn x?u h?n ho?c khng ??. HY CH?C CH?N R?NG B?N:  Hi?u r nh?ng h??ng d?n ny.  S? theo di tnh tr?ng b?nh c?a b?n.  S? yu  c?u tr? gip n?u b?n khng ?? ho?c tnh tr?ng tr?m tr?ng h?n. Document Released: 02/27/2005 Document Revised: 01/20/2013 [facility] Patient Information 2014 [place], [place].

## 2013-06-03 NOTE — Discharge Summary (Signed)
Physician Discharge Summary  Patient ID: Jonathan Boyer MRN: [id number] DOB/AGE: 10/06/1955 58 y.o.  Admit date: 06/01/2013 Discharge date: 06/03/2013  Admission Diagnoses: diverticular disease  Discharge Diagnoses:  Principal Problem:   Diverticulitis of colon with perforation Active Problems:   Diverticular disease   Discharged Condition: good  Hospital Course: Uneventful.  Brought in for monitoring due to worsening CT scan on f/u for diverticular disease.  Pt's signs and symptoms revealed no sign of sepsis or worsening disease.  Abd exam unchanged.  He was given 48h of IV antibiotics.  He will continue po antibiotics at home.  Consults: None  Significant Diagnostic Studies: labs: cbc, chemistries  Treatments: antibiotics: Invanz  Discharge Exam: Blood pressure 114/65, pulse 65, temperature 98.3 F (36.8 C), temperature source Oral, resp. rate 15, height 5\' 3"  (1.6 m), weight 151 lb 14.4 oz (68.901 kg), SpO2 99.00%. General appearance: alert and cooperative GI: soft, non-tender, non-distended  Disposition: 01-Home or Self Care     Medication List    STOP taking these medications       amoxicillin-clavulanate 875-125 MG per tablet  Commonly known as:  AUGMENTIN      TAKE these medications       ciprofloxacin 500 MG tablet  Commonly known as:  CIPRO  Take 1 tablet (500 mg total) by mouth 2 (two) times daily.     HYDROcodone-acetaminophen 5-325 MG per tablet  Commonly known as:  NORCO/VICODIN  Take 1-2 tablets by mouth every 6 (six) hours as needed for moderate pain or severe pain.     metroNIDAZOLE 500 MG tablet  Commonly known as:  FLAGYL  Take 1 tablet (500 mg total) by mouth 3 (three) times daily.           Follow-up Information   Follow up with Vanita PandaHOMAS, Sharolyn Weber C., MD. Schedule an appointment as soon as possible for a visit in 2 weeks.   Specialty:  General Surgery   Contact information:   [address] [place] [address] [phone]       Signed: Vanita PandaHOMAS, Davaughn Hillyard C. 06/03/2013, 8:26 AM

## 2015-05-05 IMAGING — CT CT ABD-PELV W/ CM
2 of 7 series · 13 of 46 positions shown, 15 images · IV contrast (APPLIED)
Comparison: 05/10/2013

CLINICAL DATA: Followup diverticulitis

EXAM:
CT ABDOMEN AND PELVIS WITH CONTRAST
TECHNIQUE: Multidetector CT imaging of the abdomen and pelvis was performed
using the standard protocol following bolus administration of
intravenous contrast.
CONTRAST:  100mL OMNIPAQUE IOHEXOL 300 MG/ML  SOLN

[Series 11: abd/ pelvis 5.0 i30f 1 · axial · 0.69mm/px · z∈[+804,+1144]mm · 10 of 84 slices shown, 12 images]
[im 8/84  soft-tissue]
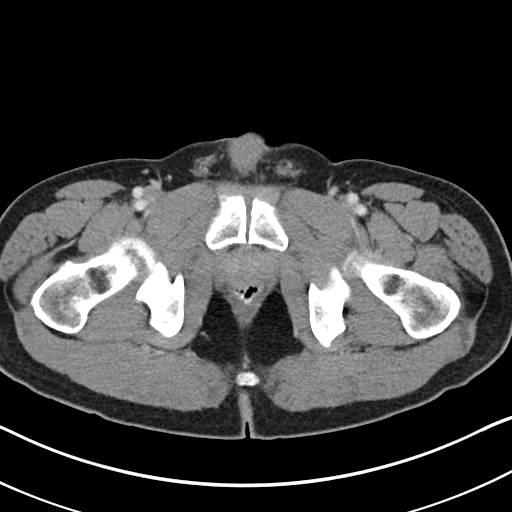
[im 8/84  bone]
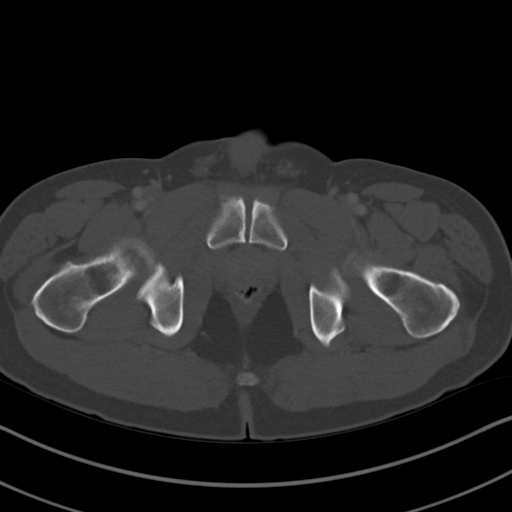
[im 16/84  soft-tissue]
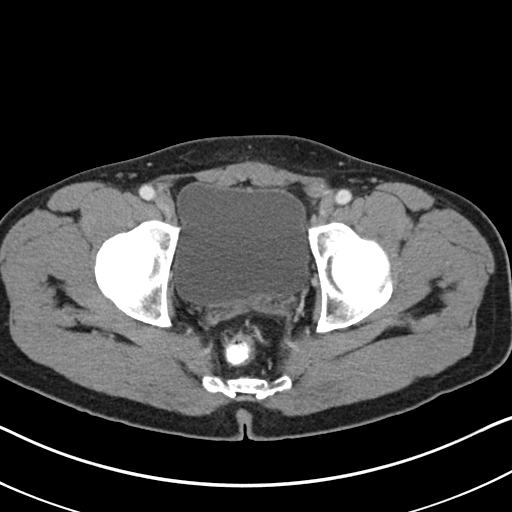
[im 23/84  soft-tissue]
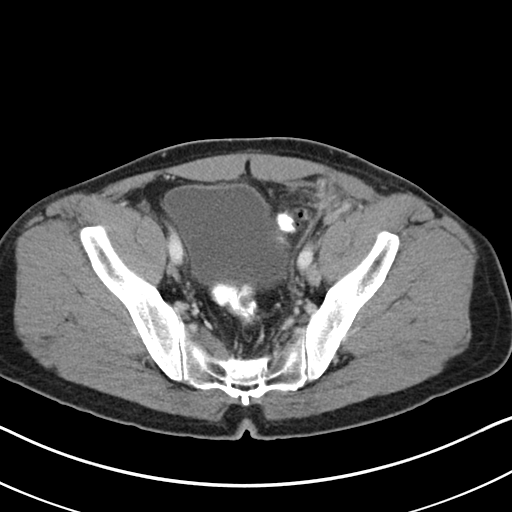
[im 31/84  soft-tissue]
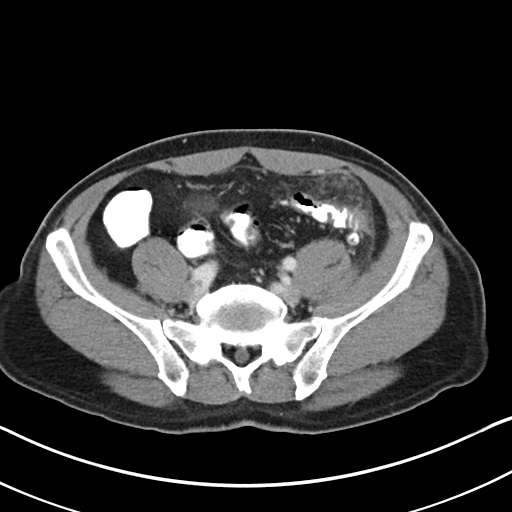
[im 38/84  soft-tissue]
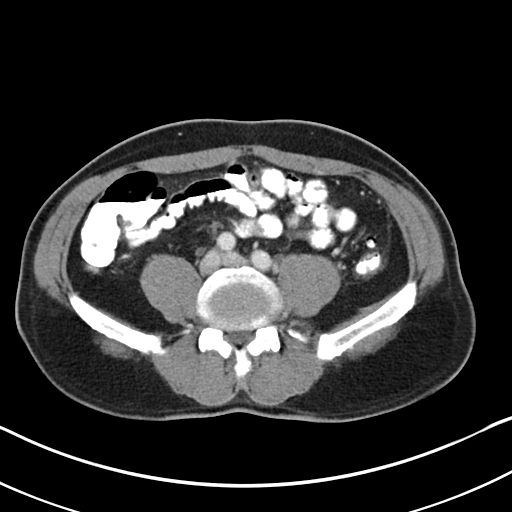
[im 46/84  soft-tissue]
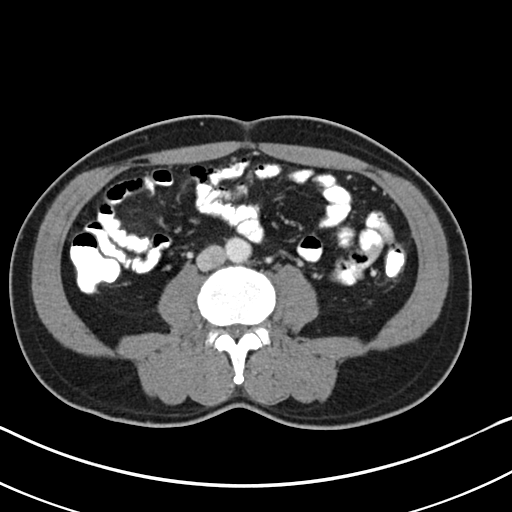
[im 53/84  soft-tissue]
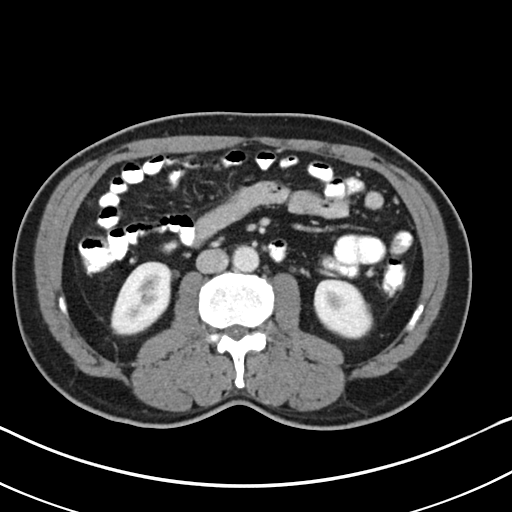
[im 61/84  soft-tissue]
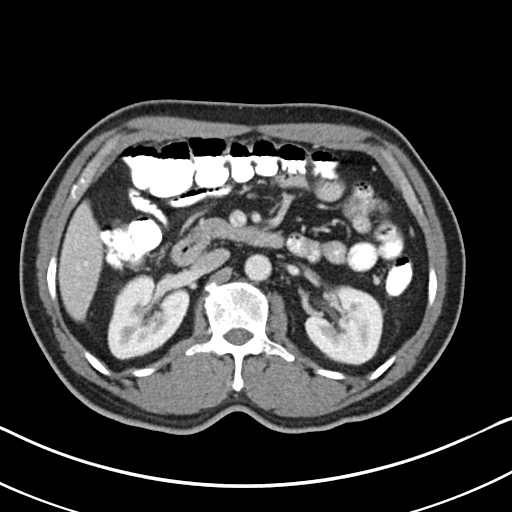
[im 68/84  soft-tissue]
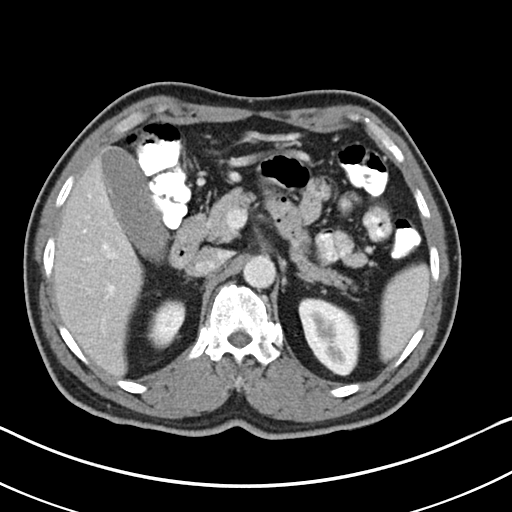
[im 68/84  bone]
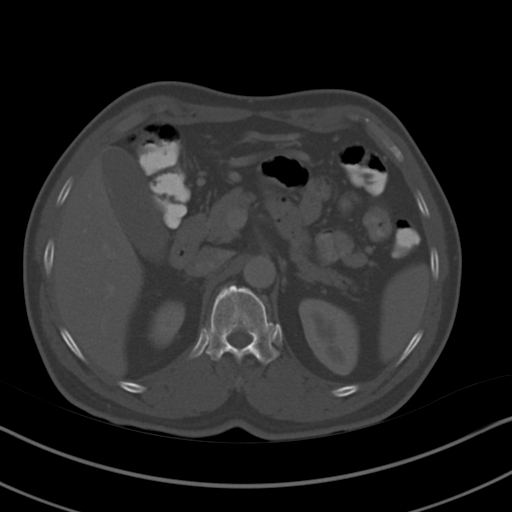
[im 76/84  soft-tissue]
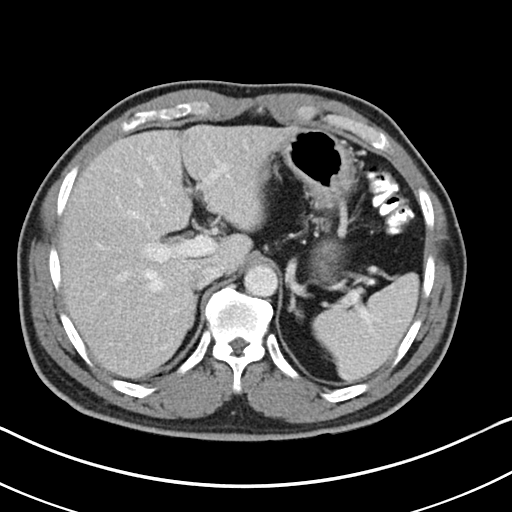

[Series 602: coronals · coronal · 0.87mm/px · 3 of 48 slices shown]
[im 16/48  soft-tissue]
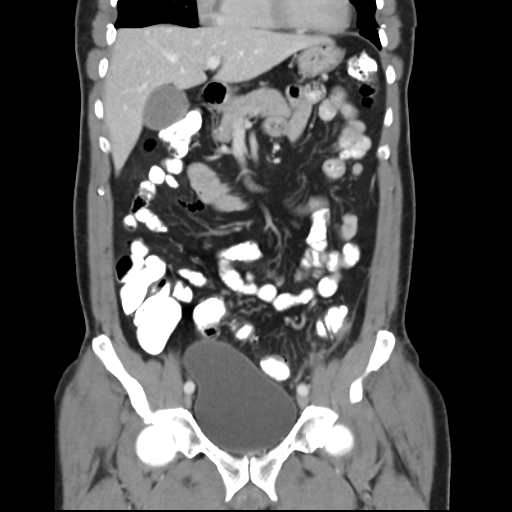
[im 21/48  soft-tissue]
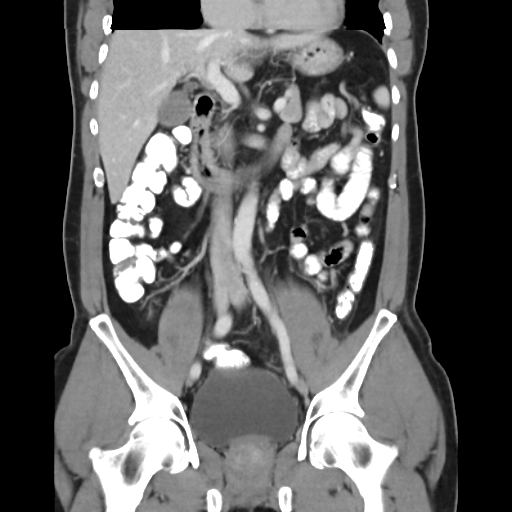
[im 27/48  soft-tissue]
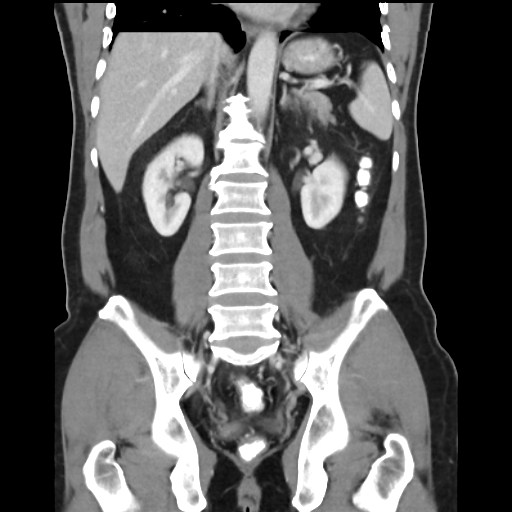

[13 of 46 positions shown; findings below may reference images not displayed]

FINDINGS: Dependent changes are noted in the lung bases. No pleural or
pericardial effusion identified. There is mild cardiac enlargement.

No focal liver abnormality identified. The gallbladder appears
normal. No biliary dilatation. Normal appearance of the pancreas.
The spleen is unremarkable.

The adrenal glands both appear normal. The right kidney is normal.
The left kidney is normal. The urinary bladder the urinary bladder
appears normal.

Normal caliber of the abdominal aorta. There is no upper abdominal
adenopathy. No pelvic or inguinal adenopathy noted.

The stomach is normal. The small bowel loops are on unremarkable.
The appendix is visualized and appears normal. The proximal colon is
normal. Wall thickening and inflammation involving the proximal
sigmoid colon is again noted compatible with diverticulitis. There
are two small fluid collections identified adjacent to the sigmoid
colon. The more ventral fluid collection measures 3 x 1 cm, image
58/series 10. More posteriorly there is a smaller fluid collection
measuring 1.2 x 1.0 cm.

Review of the visualized osseous structures is significant for mild
multilevel spondylosis of the lumbar spine.
IMPRESSION: 1. Stable inflammatory changes of diverticulitis involving the
sigmoid colon.
2. Interval development of two small pericolonic fluid collections
in the left lower quadrant compatible with diverticular abscess.
These results will be called to the ordering clinician or
representative by the Radiologist Assistant, and communication
documented in the PACS Dashboard.

## 2016-02-28 DIAGNOSIS — Z23 Encounter for immunization: Secondary | ICD-10-CM | POA: Diagnosis not present

## 2016-10-15 ENCOUNTER — Encounter: Payer: Self-pay | Admitting: Physician Assistant

## 2016-10-25 ENCOUNTER — Encounter (INDEPENDENT_AMBULATORY_CARE_PROVIDER_SITE_OTHER): Payer: Self-pay

## 2016-10-25 ENCOUNTER — Ambulatory Visit (INDEPENDENT_AMBULATORY_CARE_PROVIDER_SITE_OTHER): Payer: BLUE CROSS/BLUE SHIELD | Admitting: Physician Assistant

## 2016-10-25 ENCOUNTER — Encounter: Payer: Self-pay | Admitting: Physician Assistant

## 2016-10-25 VITALS — BP 152/78 | HR 73 | Ht 64.0 in | Wt 156.6 lb

## 2016-10-25 DIAGNOSIS — Z1212 Encounter for screening for malignant neoplasm of rectum: Secondary | ICD-10-CM

## 2016-10-25 DIAGNOSIS — Z1211 Encounter for screening for malignant neoplasm of colon: Secondary | ICD-10-CM

## 2016-10-25 DIAGNOSIS — Z8719 Personal history of other diseases of the digestive system: Secondary | ICD-10-CM

## 2016-10-25 DIAGNOSIS — R1032 Left lower quadrant pain: Secondary | ICD-10-CM

## 2016-10-25 MED ORDER — NA SULFATE-K SULFATE-MG SULF 17.5-3.13-1.6 GM/177ML PO SOLN: 1.0000 | ORAL | 0 refills | Status: DC

## 2016-10-25 NOTE — Progress Notes (Signed)
I agree with the above note, plan 

## 2016-10-25 NOTE — Progress Notes (Signed)
Chief Complaint: Previous history of diverticulitis  HPI:    Jonathan Boyer is a 61 year old male who presents to clinic today with an interpreter and his daughter who translated for him, as a new patient for his history of diverticular disease.   Upon review of chart patient initially had diagnosis of diverticulitis with microperforation and localized peritonitis in December 2014. He was admitted to the hospital during that time. Due to perpetuation of his disease and perforation with abscess patient was seen by surgery and partial colectomy was discussed at that time. Patient opted not to have this procedure done and was instead given 48 hours of IV antibiotics and sent home with a week of antibiotics. His symptoms cleared after that time.     Today, the patient explains that he has not had another episode of abdominal pain until January of this year when he experienced left lower quadrant abdominal pain for 1-2 weeks. He used over-the-counter pain medicines but never saw his doctor regarding this pain and it went away on its own. Currently, the patient tells me that he will have intermittent left lower quadrant abdominal pain and he is "ready to have surgery now". Patient denies any fevers, bleeding or change in bowel habits. He just tells me that "since I'm healthy now,l 'd rather have it taken out so it doesn't happen again". Again I affirm that the patient has only had 2 episodes now of diverticular disease. The first one in December which was not cleared initially with over-the-counter antibiotics given in in the ER and perforated with a localized abscess and his second was just a left lower quadrant pain which he describes in January though this was "not as severe as before" and patient treated at home.   Patient has never had a screening colonoscopy.   Patient denies fever, chills, blood in his stool, melena, weight loss, fatigue, anorexia, nausea, vomiting, heartburn, reflux, current abdominal pain  or symptoms that awaken him at night.  Past Medical History:  Diagnosis Date  . Diverticular disease 06/01/2013  . Medical history non-contributory     History reviewed. No pertinent surgical history.  Current Outpatient Prescriptions  Medication Sig Dispense Refill  . ibuprofen (ADVIL,MOTRIN) 200 MG tablet Take 200 mg by mouth every 6 (six) hours as needed.    . Na Sulfate-K Sulfate-Mg Sulf 17.5-3.13-1.6 GM/180ML SOLN Take 1 kit by mouth as directed. 354 mL 0   No current facility-administered medications for this visit.     Allergies as of 10/25/2016  . (No Known Allergies)    Family History  Problem Relation Age of Onset  . Cancer Mother     Social History   Social History  . Marital status: Married    Spouse name: N/A  . Number of children: N/A  . Years of education: N/A   Occupational History  . Not on file.   Social History Main Topics  . Smoking status: Never Smoker  . Smokeless tobacco: Never Used  . Alcohol use Yes     Comment: occasional  . Drug use: No  . Sexual activity: Not on file   Other Topics Concern  . Not on file   Social History Narrative  . No narrative on file    Review of Systems:    Constitutional: No weight loss, fever or chills Skin: No rash Cardiovascular: No chest pain  Respiratory: No SOB  Gastrointestinal: See HPI and otherwise negative Genitourinary: No dysuria Neurological: No headache Musculoskeletal: No new muscle or  joint pain Hematologic: No bleeding Psychiatric: No history of depression or anxiety   Physical Exam:  Vital signs: BP (!) 152/78 (BP Location: Left Arm, Patient Position: Sitting, Cuff Size: Normal)   Pulse 73   Ht 5' 4" (1.626 m)   Wt 156 lb 9.6 oz (71 kg)   BMI 26.88 kg/m   Constitutional:   Pleasant male appears to be in NAD, Well developed, Well nourished, alert and cooperative Head:  Normocephalic and atraumatic. Eyes:   PEERL, EOMI. No icterus. Conjunctiva pink. Ears:  Normal auditory  acuity. Neck:  Supple Throat: Oral cavity and pharynx without inflammation, swelling or lesion.  Respiratory: Respirations even and unlabored. Lungs clear to auscultation bilaterally.   No wheezes, crackles, or rhonchi.  Cardiovascular: Normal S1, S2. No MRG. Regular rate and rhythm. No peripheral edema, cyanosis or pallor.  Gastrointestinal:  Soft, nondistended, nontender. No rebound or guarding. Normal bowel sounds. No appreciable masses or hepatomegaly. Rectal:  Not performed.  Msk:  Symmetrical without gross deformities. Without edema, no deformity or joint abnormality.  Neurologic:  Alert and  oriented x4;  grossly normal neurologically.  Skin:   Dry and intact without significant lesions or rashes. Psychiatric: Demonstrates good judgement and reason without abnormal affect or behaviors.  No recent labs or imaging.  Assessment: 1. Screening for colorectal cancer: Patient now 61 years old and overdue for a screening colonoscopy 2. History of diverticulitis: This occurred in 0175, was complicated by microperforation and abscess, cleared with IV and oral antibiotics, one episode of left lower quadrant pain since then in January of this year but patient treated this at home with over-the-counter pain medicines, at time of initial presentation of perforation patient was talked to by surgery about partial colectomy, most of his questioning today is surrounding that, he tells me that "I'm ready for surgery", I do not believe we need to pursue surgery at this time as patient has obviously not had any problems in the past 3 years with this disease. Recommend proceeding with colonoscopy first.  Plan: 1. Scheduled patient for a screening colonoscopy in [place] with Dr. Ardis Hughs. Discussed risks, benefits, limitations and alternatives and the patient agrees to proceed. 2. We did spend a large amount of time discussing the fact that even though surgery was recommended 2 years ago, the patient has had no  problems since then and likely we do not need to pursue this at this time unless he has recurrent diverticular disease, especially since the patient does not currently have any symptoms of diverticulitis. 3. Patient to return to clinic per recommendations from Dr. Ardis Hughs after time of procedure.  Ellouise Newer, PA-C [facility] 10/25/2016, 11:51 AM

## 2016-10-25 NOTE — Patient Instructions (Signed)
You have been scheduled for a colonoscopy. Please follow written instructions given to you at your visit today.  Please pick up your prep supplies at the pharmacy within the next 1-3 days. If you use inhalers (even only as needed), please bring them with you on the day of your procedure.   

## 2016-12-31 ENCOUNTER — Encounter: Payer: BLUE CROSS/BLUE SHIELD | Admitting: Gastroenterology

## 2017-05-28 ENCOUNTER — Emergency Department (HOSPITAL_COMMUNITY): Payer: BLUE CROSS/BLUE SHIELD

## 2017-05-28 ENCOUNTER — Encounter (HOSPITAL_COMMUNITY): Payer: Self-pay | Admitting: Emergency Medicine

## 2017-05-28 ENCOUNTER — Ambulatory Visit (HOSPITAL_COMMUNITY)
Admission: EM | Admit: 2017-05-28 | Discharge: 2017-05-28 | Disposition: A | Discharge status: Home / Self Care | Payer: BLUE CROSS/BLUE SHIELD | Attending: Family Medicine | Admitting: Family Medicine

## 2017-05-28 ENCOUNTER — Other Ambulatory Visit: Payer: Self-pay

## 2017-05-28 ENCOUNTER — Inpatient Hospital Stay (HOSPITAL_COMMUNITY)
Admission: EM | Admit: 2017-05-28 | Discharge: 2017-05-31 | DRG: 392 | Disposition: A | Discharge status: Home / Self Care | Payer: BLUE CROSS/BLUE SHIELD | Attending: Surgery | Admitting: Surgery

## 2017-05-28 ENCOUNTER — Ambulatory Visit (INDEPENDENT_AMBULATORY_CARE_PROVIDER_SITE_OTHER): Payer: BLUE CROSS/BLUE SHIELD

## 2017-05-28 ENCOUNTER — Encounter (HOSPITAL_COMMUNITY): Payer: Self-pay

## 2017-05-28 DIAGNOSIS — K5720 Diverticulitis of large intestine with perforation and abscess without bleeding: Secondary | ICD-10-CM

## 2017-05-28 DIAGNOSIS — R1032 Left lower quadrant pain: Secondary | ICD-10-CM

## 2017-05-28 DIAGNOSIS — R109 Unspecified abdominal pain: Secondary | ICD-10-CM | POA: Diagnosis not present

## 2017-05-28 DIAGNOSIS — K5900 Constipation, unspecified: Secondary | ICD-10-CM | POA: Diagnosis not present

## 2017-05-28 DIAGNOSIS — K5792 Diverticulitis of intestine, part unspecified, without perforation or abscess without bleeding: Secondary | ICD-10-CM | POA: Diagnosis present

## 2017-05-28 LAB — POCT URINALYSIS DIP (DEVICE)
BILIRUBIN URINE: NEGATIVE
GLUCOSE, UA: NEGATIVE mg/dL
Ketones, ur: NEGATIVE mg/dL
Leukocytes, UA: NEGATIVE
NITRITE: NEGATIVE
Protein, ur: NEGATIVE mg/dL
Specific Gravity, Urine: 1.02 (ref 1.005–1.030)
Urobilinogen, UA: 0.2 mg/dL (ref 0.0–1.0)
pH: 5.5 (ref 5.0–8.0)

## 2017-05-28 LAB — CBC
HEMATOCRIT: 39.6 % (ref 39.0–52.0)
Hemoglobin: 12.3 g/dL — ABNORMAL LOW (ref 13.0–17.0)
MCH: 24.8 pg — ABNORMAL LOW (ref 26.0–34.0)
MCHC: 31.1 g/dL (ref 30.0–36.0)
MCV: 79.8 fL (ref 78.0–100.0)
Platelets: 280 10*3/uL (ref 150–400)
RBC: 4.96 MIL/uL (ref 4.22–5.81)
RDW: 13.8 % (ref 11.5–15.5)
WBC: 10 10*3/uL (ref 4.0–10.5)

## 2017-05-28 LAB — URINALYSIS, ROUTINE W REFLEX MICROSCOPIC
BACTERIA UA: NONE SEEN
BILIRUBIN URINE: NEGATIVE
Glucose, UA: NEGATIVE mg/dL
Ketones, ur: NEGATIVE mg/dL
Leukocytes, UA: NEGATIVE
Nitrite: NEGATIVE
PROTEIN: NEGATIVE mg/dL
SPECIFIC GRAVITY, URINE: 1.017 (ref 1.005–1.030)
SQUAMOUS EPITHELIAL / LPF: NONE SEEN
pH: 5 (ref 5.0–8.0)

## 2017-05-28 LAB — COMPREHENSIVE METABOLIC PANEL
ALBUMIN: 3.4 g/dL — AB (ref 3.5–5.0)
ALT: 76 U/L — ABNORMAL HIGH (ref 17–63)
AST: 69 U/L — AB (ref 15–41)
Alkaline Phosphatase: 97 U/L (ref 38–126)
Anion gap: 12 (ref 5–15)
BILIRUBIN TOTAL: 0.7 mg/dL (ref 0.3–1.2)
BUN: 12 mg/dL (ref 6–20)
CHLORIDE: 97 mmol/L — AB (ref 101–111)
CO2: 27 mmol/L (ref 22–32)
Calcium: 9.1 mg/dL (ref 8.9–10.3)
Creatinine, Ser: 1 mg/dL (ref 0.61–1.24)
GFR calc Af Amer: >60 mL/min (ref >60)
GFR calc non Af Amer: >60 mL/min (ref >60)
GLUCOSE: 92 mg/dL (ref 65–99)
POTASSIUM: 4.2 mmol/L (ref 3.5–5.1)
Sodium: 136 mmol/L (ref 135–145)
Total Protein: 8.9 g/dL — ABNORMAL HIGH (ref 6.5–8.1)

## 2017-05-28 LAB — LIPASE, BLOOD: LIPASE: 28 U/L (ref 11–51)

## 2017-05-28 MED ORDER — MORPHINE SULFATE (PF) 4 MG/ML IV SOLN
4.0000 mg | Freq: Once | INTRAVENOUS | Status: AC
  Administered 2017-05-28: 4 mg via INTRAVENOUS
  Filled 2017-05-28: qty 1

## 2017-05-28 MED ORDER — SODIUM CHLORIDE 0.9 % IV BOLUS (SEPSIS)
1000.0000 mL | Freq: Once | INTRAVENOUS | Status: AC
  Administered 2017-05-28: 1000 mL via INTRAVENOUS

## 2017-05-28 MED ORDER — ONDANSETRON HCL 4 MG/2ML IJ SOLN
4.0000 mg | Freq: Once | INTRAMUSCULAR | Status: AC
  Administered 2017-05-28: 4 mg via INTRAVENOUS
  Filled 2017-05-28: qty 2

## 2017-05-28 MED ORDER — IOPAMIDOL (ISOVUE-300) INJECTION 61%
INTRAVENOUS | Status: AC
  Administered 2017-05-28: 100 mL
  Filled 2017-05-28: qty 100

## 2017-05-28 NOTE — ED Triage Notes (Signed)
Pt brought in by son from UC for constipation and abd pain. Pt was sent here for further eval for possible diverticulitis.

## 2017-05-28 NOTE — ED Provider Notes (Signed)
[place] EMERGENCY DEPARTMENT Provider Note   CSN: [id number] Arrival date & time: 05/28/17  1649     History   Chief Complaint Chief Complaint  Patient presents with  . Abdominal Pain    HPI Jonathan Boyer is a 61 y.o. male.  The history is provided by the patient and medical records.  Abdominal Pain   Associated symptoms include nausea and vomiting.    61 y.o. M with hx of recurrent diverticulitis, presenting to the ED with abdominal pain.  States he has been having intermittent flares of pain since 2014.  He has had multiple episodes of diverticulitis in the past.  States he started having pain again on Sunday, has been worsening since then.  States he did have some vomiting yesterday but now just feels nauseated.  Did have a BM yesterday that was smaller in caliber than normal and appeared to have some mucous mixed in.  No melena or hematochezia.  Reports chills yesterday but no documented fever.States with his last bout of diverticulitis he had a perforation and surgery was recommended for partial colectomy with colostomy for about 6 months, however patient refused.  Past Medical History:  Diagnosis Date  . Diverticular disease 06/01/2013  . Medical history non-contributory     Patient Active Problem List   Diagnosis Date Noted  . Diverticular disease 06/01/2013  . Diverticulitis of colon with perforation 05/10/2013    History reviewed. No pertinent surgical history.     Home Medications    Prior to Admission medications   Medication Sig Start Date End Date Taking? Authorizing Provider  ibuprofen (ADVIL,MOTRIN) 200 MG tablet Take 200 mg by mouth every 6 (six) hours as needed.    [provider]  Na Sulfate-K Sulfate-Mg Sulf 17.5-3.13-1.6 GM/180ML SOLN Take 1 kit by mouth as directed. 10/25/16   Levin Erp, PA    Family History Family History  Problem Relation Age of Onset  . Cancer Mother     Social History Social  History   Tobacco Use  . Smoking status: Never Smoker  . Smokeless tobacco: Never Used  Substance Use Topics  . Alcohol use: Yes    Comment: occasional  . Drug use: No     Allergies   Patient has no known allergies.   Review of Systems Review of Systems  Gastrointestinal: Positive for abdominal pain, nausea and vomiting.  All other systems reviewed and are negative.    Physical Exam Updated Vital Signs BP 109/73 (BP Location: Right Arm)   Pulse 69   Temp 98.6 F (37 C) (Oral)   Resp 14   SpO2 97%   Physical Exam  Constitutional: He is oriented to person, place, and time. He appears well-developed and well-nourished.  HENT:  Head: Normocephalic and atraumatic.  Mouth/Throat: Oropharynx is clear and moist.  Eyes: Conjunctivae and EOM are normal. Pupils are equal, round, and reactive to light.  Neck: Normal range of motion.  Cardiovascular: Normal rate, regular rhythm and normal heart sounds.  Pulmonary/Chest: Effort normal and breath sounds normal.  Abdominal: Soft. Bowel sounds are normal. There is tenderness in the left lower quadrant. There is no rigidity and no guarding.  Musculoskeletal: Normal range of motion.  Neurological: He is alert and oriented to person, place, and time.  Skin: Skin is warm and dry.  Psychiatric: He has a normal mood and affect.  Nursing note and vitals reviewed.    ED Treatments / Results  Labs (all labs ordered are listed,  but only abnormal results are displayed) Labs Reviewed  COMPREHENSIVE METABOLIC PANEL - Abnormal; Notable for the following components:      Result Value   Chloride 97 (*)    Total Protein 8.9 (*)    Albumin 3.4 (*)    AST 69 (*)    ALT 76 (*)    All other components within normal limits  CBC - Abnormal; Notable for the following components:   Hemoglobin 12.3 (*)    MCH 24.8 (*)    All other components within normal limits  URINALYSIS, ROUTINE W REFLEX MICROSCOPIC - Abnormal; Notable for the following  components:   Hgb urine dipstick SMALL (*)    All other components within normal limits  LIPASE, BLOOD    EKG  EKG Interpretation None       Radiology Dg Abd 1 View  Result Date: 05/28/2017 CLINICAL DATA:  Left lower quadrant pain and no bowel movement since last Sunday. EXAM: ABDOMEN - 1 VIEW COMPARISON:  None. FINDINGS: Visualized bowel gas pattern is nonobstructive. No soft tissue mass or abnormal fluid collection identified. No evidence of free intraperitoneal air seen. Osseous structures are unremarkable. IMPRESSION: No acute findings.  Nonobstructive bowel gas pattern. Electronically Signed   By: Franki Cabot M.D.   On: 05/28/2017 16:40   Ct Abdomen Pelvis W Contrast  Result Date: 05/29/2017 CLINICAL DATA:  Acute onset of central abdominal pain and constipation. EXAM: CT ABDOMEN AND PELVIS WITH CONTRAST TECHNIQUE: Multidetector CT imaging of the abdomen and pelvis was performed using the standard protocol following bolus administration of intravenous contrast. CONTRAST:  123m ISOVUE-300 IOPAMIDOL (ISOVUE-300) INJECTION 61% COMPARISON:  CT of the abdomen and pelvis from 05/31/2013 FINDINGS: Lower chest: Minimal bibasilar atelectasis is noted. The visualized portions of the mediastinum are unremarkable. Hepatobiliary: The liver is unremarkable in appearance. The gallbladder is unremarkable in appearance. The common bile duct remains normal in caliber. Pancreas: The pancreas is within normal limits. Spleen: The spleen is unremarkable in appearance. Adrenals/Urinary Tract: The adrenal glands are unremarkable in appearance. The kidneys are within normal limits. There is no evidence of hydronephrosis. No renal or ureteral stones are identified. No perinephric stranding is seen. Stomach/Bowel: The stomach is unremarkable in appearance. The small bowel is within normal limits. The appendix is normal in caliber, without evidence of appendicitis. Scattered diverticulosis is noted along the  descending and sigmoid colon. There is a small 1.6 cm extraluminal collection of fluid and air noted adjacent to the proximal sigmoid colon, with diffuse surrounding soft tissue inflammation. This is compatible with contained perforation secondary to acute diverticulitis. Minimal peripheral enhancement is noted, raising concern for abscess formation. Soft tissue inflammation extends superiorly along the mesentery. The small bowel is grossly unremarkable in appearance, though soft tissue inflammation tracks about small bowel loops. The stomach is unremarkable in appearance. Vascular/Lymphatic: The abdominal aorta is unremarkable in appearance. The inferior vena cava is grossly unremarkable. No retroperitoneal lymphadenopathy is seen. No pelvic sidewall lymphadenopathy is identified. Reproductive: The bladder is mildly distended and grossly unremarkable. The prostate remains normal in size. Other: No additional soft tissue abnormalities are seen. Musculoskeletal: No acute osseous abnormalities are identified. The visualized musculature is unremarkable in appearance. IMPRESSION: 1. Acute diverticulitis at the proximal sigmoid colon, with 1.6 cm extraluminal collection of fluid and air adjacent to the proximal sigmoid colon and diffuse surrounding soft tissue inflammation. This is compatible with contained perforation. Minimal peripheral enhancement noted, raising concern for abscess formation. Soft tissue inflammation extends superiorly along the  mesentery, tracking about small bowel loops. 2. Scattered diverticulosis along the descending and sigmoid colon. These results were called by telephone at the time of interpretation on 05/28/2017 at 11:59 pm to Dr. Rex Kras, who verbally acknowledged these results. Electronically Signed   By: Garald Balding M.D.   On: 05/29/2017 00:04    Procedures Procedures (including critical care time)  Medications Ordered in ED Medications  enoxaparin (LOVENOX) injection 40 mg (not  administered)  0.9 %  sodium chloride infusion ( Intravenous New Bag/Given 05/29/17 0136)  piperacillin-tazobactam (ZOSYN) IVPB 3.375 g (3.375 g Intravenous Not Given 05/29/17 0040)  acetaminophen (TYLENOL) tablet 650 mg (not administered)    Or  acetaminophen (TYLENOL) suppository 650 mg (not administered)  oxyCODONE (Oxy IR/ROXICODONE) immediate release tablet 5-10 mg (not administered)  HYDROmorphone (DILAUDID) injection 0.5 mg (not administered)  diphenhydrAMINE (BENADRYL) 12.5 MG/5ML elixir 12.5 mg (not administered)    Or  diphenhydrAMINE (BENADRYL) injection 12.5 mg (not administered)  ondansetron (ZOFRAN-ODT) disintegrating tablet 4 mg (not administered)    Or  ondansetron (ZOFRAN) injection 4 mg (not administered)  metoprolol tartrate (LOPRESSOR) injection 5 mg (not administered)  hydrALAZINE (APRESOLINE) injection 10 mg (not administered)  morphine 4 MG/ML injection 4 mg (4 mg Intravenous Given 05/28/17 2229)  ondansetron (ZOFRAN) injection 4 mg (4 mg Intravenous Given 05/28/17 2228)  sodium chloride 0.9 % bolus 1,000 mL (0 mLs Intravenous Stopped 05/29/17 0000)  iopamidol (ISOVUE-300) 61 % injection (100 mLs  Contrast Given 05/28/17 2328)  piperacillin-tazobactam (ZOSYN) IVPB 3.375 g (0 g Intravenous Stopped 05/29/17 0113)     Initial Impression / Assessment and Plan / ED Course  I have reviewed the triage vital signs and the nursing notes.  Pertinent labs & imaging results that were available during my care of the patient were reviewed by me and considered in my medical decision making (see chart for details).  61 y.o. M here with LLQ abdominal pain.  Has hx of recurrent diverticulitis with prior perforations.  He is afebrile and nontoxic.  Does have tenderness in the left lower quadrant without peritoneal signs.  Screening lab work overall reassuring.  Given his history and current symptoms, will obtain CT scan.  CT with findings of diverticulitis with 1.6 cm contained  perforation.  There is some peripheral enhancement raising concern for developing abscess.  Discussed with general surgery, Dr. Windle Guard, she will admit for ongoing care.  Recommended Zosyn which was started here.  Patient and family updated with plan of care.  Final Clinical Impressions(s) / ED Diagnoses   Final diagnoses:  Diverticulitis of large intestine with perforation and abscess without bleeding    ED Discharge Orders    None       Larene Pickett, PA-C 05/29/17 0235    Macarthur Critchley, MD 06/04/17 3612

## 2017-05-28 NOTE — ED Notes (Signed)
Patient transported to CT 

## 2017-05-28 NOTE — Discharge Instructions (Signed)
I need for you to go to the emergency department because your symptoms are consistent with acute diverticulitis

## 2017-05-28 NOTE — ED Provider Notes (Signed)
[facility]   [id number] 05/28/17 Arrival Time: 1521   SUBJECTIVE:  Jonathan Boyer is a 61 y.o. male who presents to the urgent care with complaint of LLQ pain and no BM since last Sunday (10 days).  Patient states that he has had left lower quadrant pain off and on for 4 years.  It has gotten worse in the last 10 days.  At one point, surgery was recommended but he went back to [place] and never followed up on the recommendation.  He does not have a local doctor.  He has been living in the [place] a number years.  Review of past medical history reveals a history of diverticulitis with perforation 4 years ago.  Past Medical History:  Diagnosis Date  . Diverticular disease 06/01/2013  . Medical history non-contributory    Family History  Problem Relation Age of Onset  . Cancer Mother    Social History   Socioeconomic History  . Marital status: Married    Spouse name: Not on file  . Number of children: Not on file  . Years of education: Not on file  . Highest education level: Not on file  Social Needs  . Financial resource strain: Not on file  . Food insecurity - worry: Not on file  . Food insecurity - inability: Not on file  . Transportation needs - medical: Not on file  . Transportation needs - non-medical: Not on file  Occupational History  . Not on file  Tobacco Use  . Smoking status: Never Smoker  . Smokeless tobacco: Never Used  Substance and Sexual Activity  . Alcohol use: Yes    Comment: occasional  . Drug use: No  . Sexual activity: Not on file  Other Topics Concern  . Not on file  Social History Narrative  . Not on file   No outpatient medications have been marked as taking for the 05/28/17 encounter [facility](Hospital Encounter).   No Known Allergies    ROS: As per HPI, remainder of ROS negative.   OBJECTIVE:   Vitals:   05/28/17 1548  BP: 137/71  Pulse: 74  Resp: 16  Temp: 99.1 F (37.3 C)  TempSrc: Oral  SpO2: 98%      General appearance: alert; no distress Eyes: PERRL; EOMI; conjunctiva normal HENT: normocephalic; atraumatic, external ears normal without trauma; nasal mucosa normal; oral mucosa normal Neck: supple Lungs: clear to auscultation bilaterally Heart: regular rate and rhythm Abdomen: soft, tender left lower quadrant, no guarding or rebound, fullness in the left lower quadrant. Back: no CVA tenderness Extremities: no cyanosis or edema; symmetrical with no gross deformities Skin: warm and dry Neurologic: normal gait; grossly normal Psychological: alert and cooperative; normal mood and affect   Labs:  Results for orders placed or performed during the hospital encounter of 05/28/17  POCT urinalysis dip (device)  Result Value Ref Range   Glucose, UA NEGATIVE NEGATIVE mg/dL   Bilirubin Urine NEGATIVE NEGATIVE   Ketones, ur NEGATIVE NEGATIVE mg/dL   Specific Gravity, Urine 1.020 1.005 - 1.030   Hgb urine dipstick TRACE (A) NEGATIVE   pH 5.5 5.0 - 8.0   Protein, ur NEGATIVE NEGATIVE mg/dL   Urobilinogen, UA 0.2 0.0 - 1.0 mg/dL   Nitrite NEGATIVE NEGATIVE   Leukocytes, UA NEGATIVE NEGATIVE    Labs Reviewed  POCT URINALYSIS DIP (DEVICE) - Abnormal; Notable for the following components:      Result Value   Hgb urine dipstick TRACE (*)    All  other components within normal limits    No results found.     ASSESSMENT & PLAN:  1. Acute diverticulitis    Sent to ED because of past perforation and clinical signs of acute diverticulitis.   Reviewed expectations re: course of current medical issues. Questions answered. Outlined signs and symptoms indicating need for more acute intervention. Patient verbalized understanding. After Visit Summary given.        Elvina SidleLauenstein, Dallie Patton, MD 05/28/17 [phone]

## 2017-05-28 NOTE — ED Triage Notes (Signed)
Pt reports LLQ pain and no BM since last Sunday (10 days).

## 2017-05-29 DIAGNOSIS — K5792 Diverticulitis of intestine, part unspecified, without perforation or abscess without bleeding: Secondary | ICD-10-CM | POA: Diagnosis present

## 2017-05-29 DIAGNOSIS — K5900 Constipation, unspecified: Secondary | ICD-10-CM | POA: Diagnosis present

## 2017-05-29 DIAGNOSIS — K5720 Diverticulitis of large intestine with perforation and abscess without bleeding: Secondary | ICD-10-CM | POA: Diagnosis not present

## 2017-05-29 LAB — BASIC METABOLIC PANEL
ANION GAP: 7 (ref 5–15)
BUN: 9 mg/dL (ref 6–20)
CHLORIDE: 102 mmol/L (ref 101–111)
CO2: 27 mmol/L (ref 22–32)
Calcium: 8.7 mg/dL — ABNORMAL LOW (ref 8.9–10.3)
Creatinine, Ser: 0.94 mg/dL (ref 0.61–1.24)
Glucose, Bld: 96 mg/dL (ref 65–99)
POTASSIUM: 4 mmol/L (ref 3.5–5.1)
SODIUM: 136 mmol/L (ref 135–145)

## 2017-05-29 LAB — CBC
HEMATOCRIT: 36 % — AB (ref 39.0–52.0)
HEMOGLOBIN: 11.4 g/dL — AB (ref 13.0–17.0)
MCH: 24.9 pg — ABNORMAL LOW (ref 26.0–34.0)
MCHC: 31.7 g/dL (ref 30.0–36.0)
MCV: 78.6 fL (ref 78.0–100.0)
Platelets: 251 10*3/uL (ref 150–400)
RBC: 4.58 MIL/uL (ref 4.22–5.81)
RDW: 13.4 % (ref 11.5–15.5)
WBC: 7.5 10*3/uL (ref 4.0–10.5)

## 2017-05-29 LAB — HIV ANTIBODY (ROUTINE TESTING W REFLEX): HIV SCREEN 4TH GENERATION: NONREACTIVE

## 2017-05-29 MED ORDER — HYDROMORPHONE HCL 1 MG/ML IJ SOLN
0.5000 mg | INTRAMUSCULAR | Status: DC | PRN
  Administered 2017-05-29: 0.5 mg via INTRAVENOUS
  Filled 2017-05-29: qty 1

## 2017-05-29 MED ORDER — METOPROLOL TARTRATE 5 MG/5ML IV SOLN: 5.0000 mg | Freq: Four times a day (QID) | INTRAVENOUS | Status: DC | PRN

## 2017-05-29 MED ORDER — HYDRALAZINE HCL 20 MG/ML IJ SOLN: 10.0000 mg | INTRAMUSCULAR | Status: DC | PRN

## 2017-05-29 MED ORDER — DIPHENHYDRAMINE HCL 50 MG/ML IJ SOLN: 12.5000 mg | Freq: Four times a day (QID) | INTRAMUSCULAR | Status: DC | PRN

## 2017-05-29 MED ORDER — DIPHENHYDRAMINE HCL 12.5 MG/5ML PO ELIX: 12.5000 mg | ORAL_SOLUTION | Freq: Four times a day (QID) | ORAL | Status: DC | PRN

## 2017-05-29 MED ORDER — PIPERACILLIN-TAZOBACTAM 3.375 G IVPB
3.3750 g | Freq: Three times a day (TID) | INTRAVENOUS | Status: DC
  Administered 2017-05-29 – 2017-05-30 (×4): 3.375 g via INTRAVENOUS
  Filled 2017-05-29 (×5): qty 50

## 2017-05-29 MED ORDER — OXYCODONE HCL 5 MG PO TABS
5.0000 mg | ORAL_TABLET | ORAL | Status: DC | PRN
  Administered 2017-05-30: 10 mg via ORAL
  Filled 2017-05-29: qty 2

## 2017-05-29 MED ORDER — ONDANSETRON 4 MG PO TBDP
4.0000 mg | ORAL_TABLET | Freq: Four times a day (QID) | ORAL | Status: DC | PRN
Start: 2017-05-29 — End: 2017-05-31
  Administered 2017-05-30: 4 mg via ORAL
  Filled 2017-05-29: qty 1

## 2017-05-29 MED ORDER — SODIUM CHLORIDE 0.9 % IV SOLN
INTRAVENOUS | Status: DC
  Administered 2017-05-29 – 2017-05-31 (×8): via INTRAVENOUS

## 2017-05-29 MED ORDER — ENOXAPARIN SODIUM 40 MG/0.4ML ~~LOC~~ SOLN
40.0000 mg | Freq: Every day | SUBCUTANEOUS | Status: DC
  Administered 2017-05-29 – 2017-05-30 (×2): 40 mg via SUBCUTANEOUS
  Filled 2017-05-29 (×3): qty 0.4

## 2017-05-29 MED ORDER — ACETAMINOPHEN 650 MG RE SUPP: 650.0000 mg | Freq: Four times a day (QID) | RECTAL | Status: DC | PRN

## 2017-05-29 MED ORDER — ONDANSETRON HCL 4 MG/2ML IJ SOLN
4.0000 mg | Freq: Four times a day (QID) | INTRAMUSCULAR | Status: DC | PRN
  Administered 2017-05-31: 4 mg via INTRAVENOUS
  Filled 2017-05-29: qty 2

## 2017-05-29 MED ORDER — PIPERACILLIN-TAZOBACTAM 3.375 G IVPB 30 MIN
3.3750 g | Freq: Once | INTRAVENOUS | Status: AC
  Administered 2017-05-29: 3.375 g via INTRAVENOUS
  Filled 2017-05-29: qty 50

## 2017-05-29 MED ORDER — ACETAMINOPHEN 325 MG PO TABS
650.0000 mg | ORAL_TABLET | Freq: Four times a day (QID) | ORAL | Status: DC | PRN
  Administered 2017-05-29: 650 mg via ORAL
  Filled 2017-05-29: qty 2

## 2017-05-29 NOTE — Progress Notes (Signed)
Received pt from ED. Pt A&Ox4. Denies c/o pain. Skin intact. Oriented to room and call bell.

## 2017-05-29 NOTE — Progress Notes (Signed)
Subjective/Chief Complaint: Feels better today   Objective: Vital signs in last 24 hours: Temp:  [98.4 F (36.9 C)-99.1 F (37.3 C)] 98.6 F (37 C) (12/27 0527) Pulse Rate:  [63-75] 67 (12/27 0527) Resp:  [14-18] 18 (12/27 0527) BP: (109-138)/(70-87) 122/70 (12/27 0527) SpO2:  [96 %-100 %] 98 % (12/27 0527) Weight:  [69.8 kg (153 lb 14.4 oz)] 69.8 kg (153 lb 14.4 oz) (12/27 0149) Last BM Date: 05/28/17  Intake/Output from previous day: 12/26 0701 - 12/27 0700 In: 1581.3 [I.V.:481.3; IV Piggyback:1100] Out: -  Intake/Output this shift: No intake/output data recorded.  General appearance: alert and cooperative Resp: clear to auscultation bilaterally GI: soft, nondistended, mild LLQ tenderness Skin: Skin color, texture, turgor normal. No rashes or lesions Neurologic: Grossly normal  Lab Results:  Recent Labs    05/28/17 1719 05/29/17 0508  WBC 10.0 7.5  HGB 12.3* 11.4*  HCT 39.6 36.0*  PLT 280 251   BMET Recent Labs    05/28/17 1719 05/29/17 0508  NA 136 136  K 4.2 4.0  CL 97* 102  CO2 27 27  GLUCOSE 92 96  BUN 12 9  CREATININE 1.00 0.94  CALCIUM 9.1 8.7*   PT/INR No results for input(s): LABPROT, INR in the last 72 hours. ABG No results for input(s): PHART, HCO3 in the last 72 hours.  Invalid input(s): PCO2, PO2  Studies/Results: Dg Abd 1 View  Result Date: 05/28/2017 CLINICAL DATA:  Left lower quadrant pain and no bowel movement since last Sunday. EXAM: ABDOMEN - 1 VIEW COMPARISON:  None. FINDINGS: Visualized bowel gas pattern is nonobstructive. No soft tissue mass or abnormal fluid collection identified. No evidence of free intraperitoneal air seen. Osseous structures are unremarkable. IMPRESSION: No acute findings.  Nonobstructive bowel gas pattern. Electronically Signed   By: Bary RichardStan  Maynard M.D.   On: 05/28/2017 16:40   Ct Abdomen Pelvis W Contrast  Result Date: 05/29/2017 CLINICAL DATA:  Acute onset of central abdominal pain and  constipation. EXAM: CT ABDOMEN AND PELVIS WITH CONTRAST TECHNIQUE: Multidetector CT imaging of the abdomen and pelvis was performed using the standard protocol following bolus administration of intravenous contrast. CONTRAST:  100mL ISOVUE-300 IOPAMIDOL (ISOVUE-300) INJECTION 61% COMPARISON:  CT of the abdomen and pelvis from 05/31/2013 FINDINGS: Lower chest: Minimal bibasilar atelectasis is noted. The visualized portions of the mediastinum are unremarkable. Hepatobiliary: The liver is unremarkable in appearance. The gallbladder is unremarkable in appearance. The common bile duct remains normal in caliber. Pancreas: The pancreas is within normal limits. Spleen: The spleen is unremarkable in appearance. Adrenals/Urinary Tract: The adrenal glands are unremarkable in appearance. The kidneys are within normal limits. There is no evidence of hydronephrosis. No renal or ureteral stones are identified. No perinephric stranding is seen. Stomach/Bowel: The stomach is unremarkable in appearance. The small bowel is within normal limits. The appendix is normal in caliber, without evidence of appendicitis. Scattered diverticulosis is noted along the descending and sigmoid colon. There is a small 1.6 cm extraluminal collection of fluid and air noted adjacent to the proximal sigmoid colon, with diffuse surrounding soft tissue inflammation. This is compatible with contained perforation secondary to acute diverticulitis. Minimal peripheral enhancement is noted, raising concern for abscess formation. Soft tissue inflammation extends superiorly along the mesentery. The small bowel is grossly unremarkable in appearance, though soft tissue inflammation tracks about small bowel loops. The stomach is unremarkable in appearance. Vascular/Lymphatic: The abdominal aorta is unremarkable in appearance. The inferior vena cava is grossly unremarkable. No retroperitoneal lymphadenopathy is  seen. No pelvic sidewall lymphadenopathy is identified.  Reproductive: The bladder is mildly distended and grossly unremarkable. The prostate remains normal in size. Other: No additional soft tissue abnormalities are seen. Musculoskeletal: No acute osseous abnormalities are identified. The visualized musculature is unremarkable in appearance. IMPRESSION: 1. Acute diverticulitis at the proximal sigmoid colon, with 1.6 cm extraluminal collection of fluid and air adjacent to the proximal sigmoid colon and diffuse surrounding soft tissue inflammation. This is compatible with contained perforation. Minimal peripheral enhancement noted, raising concern for abscess formation. Soft tissue inflammation extends superiorly along the mesentery, tracking about small bowel loops. 2. Scattered diverticulosis along the descending and sigmoid colon. These results were called by telephone at the time of interpretation on 05/28/2017 at 11:59 pm to Dr. Clarene DukeLittle, who verbally acknowledged these results. Electronically Signed   By: Roanna RaiderJeffery  Chang M.D.   On: 05/29/2017 00:04    Anti-infectives: Anti-infectives (From admission, onward)   Start     Dose/Rate Route Frequency Ordered Stop   05/29/17 0045  piperacillin-tazobactam (ZOSYN) IVPB 3.375 g     3.375 g 12.5 mL/hr over 240 Minutes Intravenous Every 8 hours 05/29/17 0036     05/29/17 0030  piperacillin-tazobactam (ZOSYN) IVPB 3.375 g     3.375 g 100 mL/hr over 30 Minutes Intravenous  Once 05/29/17 0025 05/29/17 0113      Assessment/Plan: Diverticulitis with contained perforation Continue bowel rest and IV abx  LOS: 0 days    Berna BueChelsea A Allyse Fregeau 05/29/2017

## 2017-05-29 NOTE — H&P (Addendum)
Surgical H&P  CC: abdominal pain  HPI: 61yo man who presents to the [facility] ER with abdominal pain and constipation. Has a history of perforated diverticulitis in Dec 2014 managed with IV abx and bowel rest. Sigmoid colectomy was recommended upon recovery but he did not pursue this as he did not have insurance at the time. Has been having intermittent LLQ pain since then, and this bout began about 3 days ago (Sunday) and has been worsening. Associated with emesis and nausea (started yesterday). Last BM was yesterday and was small, no melena or hematochezia. No known fever. Does feel achy all over and some chills.  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   No Known Allergies  Past Medical History:  Diagnosis Date  . Diverticular disease 06/01/2013  . Medical history non-contributory     History reviewed. No pertinent surgical history.  Family History  Problem Relation Age of Onset  . Cancer Mother     Social History   Socioeconomic History  . Marital status: Married    Spouse name: None  . Number of children: None  . Years of education: None  . Highest education level: None  Social Needs  . Financial resource strain: None  . Food insecurity - worry: None  . Food insecurity - inability: None  . Transportation needs - medical: None  . Transportation needs - non-medical: None  Occupational History  . None  Tobacco Use  . Smoking status: Never Smoker  . Smokeless tobacco: Never Used  Substance and Sexual Activity  . Alcohol use: Yes    Comment: occasional  . Drug use: No  . Sexual activity: None  Other Topics Concern  . None  Social History Narrative  . None    No current facility-administered medications on file prior to encounter.    Current Outpatient Medications on File Prior to Encounter  Medication Sig Dispense Refill  . amoxicillin (AMOXIL) 500 MG capsule Take 500 mg by mouth 2 (two) times daily.    Marland Kitchen ibuprofen (ADVIL,MOTRIN) 200  MG tablet Take 200 mg by mouth every 6 (six) hours as needed.    . ranitidine (ZANTAC) 150 MG tablet Take 150 mg by mouth daily.    . Na Sulfate-K Sulfate-Mg Sulf 17.5-3.13-1.6 GM/180ML SOLN Take 1 kit by mouth as directed. (Patient not taking: Reported on 05/28/2017) 354 mL 0    Review of Systems: a complete, 10pt review of systems was completed with pertinent positives and negatives as documented in the HPI  Physical Exam: Vitals:   05/28/17 2102 05/28/17 2300  BP: 109/73 132/77  Pulse: 69 67  Resp: 14 14  Temp:    SpO2: 97% 100%   Gen: A&Ox3, no distress  Head: normocephalic, atraumatic, EOMI, anicteric.  Neck: supple without mass or thyromegaly Chest: unlabored respirations, symmetrical air entry   Cardiovascular: RRR with palpable distal pulses, no pedal edema Abdomen: soft, nondistended, tender LLQ. No mass or organomegaly. No rebound or guarding, no peritoneal signs Extremities: warm, without edema, no deformities  Neuro: grossly intact Psych: appropriate mood and affect  Skin: warm and dry   CBC Latest Ref Rng & Units 05/28/2017 06/03/2013 06/02/2013  WBC 4.0 - 10.5 K/uL 10.0 5.0 4.6  Hemoglobin 13.0 - 17.0 g/dL 12.3(L) 13.3 13.5  Hematocrit 39.0 - 52.0 % 39.6 41.9 42.1  Platelets 150 - 400 K/uL 280 202 211    CMP Latest Ref Rng & Units 05/28/2017 06/02/2013 06/01/2013  Glucose 65 - 99  mg/dL 92 95 104(H)  BUN 6 - 20 mg/dL _0 Creatinine 0.61 - 1.24 mg/dL 1.00 1.00 1.08  Sodium 135 - 145 mmol/L 136 139 138  Potassium 3.5 - 5.1 mmol/L 4.2 4.0 4.0  Chloride 101 - 111 mmol/L 97(L) 101 98  CO2 22 - 32 mmol/L _1 Calcium 8.9 - 10.3 mg/dL 9.1 9.5 10.0  Total Protein 6.5 - 8.1 g/dL 8.9(H) - -  Total Bilirubin 0.3 - 1.2 mg/dL 0.7 - -  Alkaline Phos 38 - 126 U/L 97 - -  AST 15 - 41 U/L 69(H) - -  ALT 17 - 63 U/L 76(H) - -    No results found for: INR, PROTIME  Imaging: Dg Abd 1 View  Result Date: 05/28/2017 CLINICAL DATA:  Left lower quadrant pain and  no bowel movement since last Sunday. EXAM: ABDOMEN - 1 VIEW COMPARISON:  None. FINDINGS: Visualized bowel gas pattern is nonobstructive. No soft tissue mass or abnormal fluid collection identified. No evidence of free intraperitoneal air seen. Osseous structures are unremarkable. IMPRESSION: No acute findings.  Nonobstructive bowel gas pattern. Electronically Signed   By: Franki Cabot M.D.   On: 05/28/2017 16:40   Ct Abdomen Pelvis W Contrast  Result Date: 05/29/2017 CLINICAL DATA:  Acute onset of central abdominal pain and constipation. EXAM: CT ABDOMEN AND PELVIS WITH CONTRAST TECHNIQUE: Multidetector CT imaging of the abdomen and pelvis was performed using the standard protocol following bolus administration of intravenous contrast. CONTRAST:  161m ISOVUE-300 IOPAMIDOL (ISOVUE-300) INJECTION 61% COMPARISON:  CT of the abdomen and pelvis from 05/31/2013 FINDINGS: Lower chest: Minimal bibasilar atelectasis is noted. The visualized portions of the mediastinum are unremarkable. Hepatobiliary: The liver is unremarkable in appearance. The gallbladder is unremarkable in appearance. The common bile duct remains normal in caliber. Pancreas: The pancreas is within normal limits. Spleen: The spleen is unremarkable in appearance. Adrenals/Urinary Tract: The adrenal glands are unremarkable in appearance. The kidneys are within normal limits. There is no evidence of hydronephrosis. No renal or ureteral stones are identified. No perinephric stranding is seen. Stomach/Bowel: The stomach is unremarkable in appearance. The small bowel is within normal limits. The appendix is normal in caliber, without evidence of appendicitis. Scattered diverticulosis is noted along the descending and sigmoid colon. There is a small 1.6 cm extraluminal collection of fluid and air noted adjacent to the proximal sigmoid colon, with diffuse surrounding soft tissue inflammation. This is compatible with contained perforation secondary to acute  diverticulitis. Minimal peripheral enhancement is noted, raising concern for abscess formation. Soft tissue inflammation extends superiorly along the mesentery. The small bowel is grossly unremarkable in appearance, though soft tissue inflammation tracks about small bowel loops. The stomach is unremarkable in appearance. Vascular/Lymphatic: The abdominal aorta is unremarkable in appearance. The inferior vena cava is grossly unremarkable. No retroperitoneal lymphadenopathy is seen. No pelvic sidewall lymphadenopathy is identified. Reproductive: The bladder is mildly distended and grossly unremarkable. The prostate remains normal in size. Other: No additional soft tissue abnormalities are seen. Musculoskeletal: No acute osseous abnormalities are identified. The visualized musculature is unremarkable in appearance. IMPRESSION: 1. Acute diverticulitis at the proximal sigmoid colon, with 1.6 cm extraluminal collection of fluid and air adjacent to the proximal sigmoid colon and diffuse surrounding soft tissue inflammation. This is compatible with contained perforation. Minimal peripheral enhancement noted, raising concern for abscess formation. Soft tissue inflammation extends superiorly along the mesentery, tracking about small bowel loops. 2. Scattered diverticulosis along the descending and sigmoid colon.  These results were called by telephone at the time of interpretation on 05/28/2017 at 11:59 pm to Dr. Rex Kras, who verbally acknowledged these results. Electronically Signed   By: Garald Balding M.D.   On: 05/29/2017 00:04    A/P: Recurrent perforated diverticulitis. Extraluminal fluid is likely too small to drain. Will admit for iv abx, bowel rest, serial exams. Discussed need for sigmoid resection with colostomy if he worsens despite antibiotics, but hopefully can quell this episode and get him to elective colon resection.  His daughter Estill Bamberg would like to be updated if any decision for surgery is made:  [place], MD [facility], [place] Pager [phone]

## 2017-05-30 MED ORDER — CIPROFLOXACIN HCL 500 MG PO TABS
500.0000 mg | ORAL_TABLET | Freq: Two times a day (BID) | ORAL | Status: DC
  Administered 2017-05-30 – 2017-05-31 (×2): 500 mg via ORAL
  Filled 2017-05-30 (×3): qty 1

## 2017-05-30 MED ORDER — METRONIDAZOLE 500 MG PO TABS
500.0000 mg | ORAL_TABLET | Freq: Three times a day (TID) | ORAL | Status: DC
  Administered 2017-05-30 – 2017-05-31 (×3): 500 mg via ORAL
  Filled 2017-05-30 (×3): qty 1

## 2017-05-30 NOTE — Progress Notes (Signed)
CC: Abdominal pain  Subjective: Doing much better this a.m. no pain.  Wants to know when he can eat.  It does not sound like he had any follow-up after his bout of diverticulitis in 2014.  Objective: Vital signs in last 24 hours: Temp:  [97.8 F (36.6 C)-98.8 F (37.1 C)] 98.3 F (36.8 C) (12/28 0600) Pulse Rate:  [58-70] 65 (12/28 0600) Resp:  [18] 18 (12/28 0526) BP: (118-140)/(63-77) 118/68 (12/28 0600) SpO2:  [98 %-100 %] 99 % (12/28 0600) Last BM Date: 05/28/17 NPO 1693 IV Urine x 0 recorded Afebrile, VSS Labs OK yesterday CT scan 12/26:  Scattered diverticulosis is noted along the descending and sigmoid colon. There is a small 1.6 cm extraluminal collection of fluid and air noted adjacent to the proximal sigmoid colon, with diffuse surrounding soft tissue inflammation. This is compatible with contained perforation secondary to acute diverticulitis. Minimal peripheral enhancement is noted, raising concern for abscess formation. Soft tissue inflammation extends superiorly along the mesentery.  Intake/Output from previous day: 12/27 0701 - 12/28 0700 In: 1693.8 [I.V.:1693.8] Out: -  Intake/Output this shift: No intake/output data recorded.  General appearance: alert, cooperative and no distress GI: soft, non-tender; bowel sounds normal; no masses,  no organomegaly  Lab Results:  Recent Labs    05/28/17 1719 05/29/17 0508  WBC 10.0 7.5  HGB 12.3* 11.4*  HCT 39.6 36.0*  PLT 280 251    BMET Recent Labs    05/28/17 1719 05/29/17 0508  NA 136 136  K 4.2 4.0  CL 97* 102  CO2 27 27  GLUCOSE 92 96  BUN 12 9  CREATININE 1.00 0.94  CALCIUM 9.1 8.7*   PT/INR No results for input(s): LABPROT, INR in the last 72 hours.  Recent Labs  Lab 05/28/17 1719  AST 69*  ALT 76*  ALKPHOS 97  BILITOT 0.7  PROT 8.9*  ALBUMIN 3.4*     Lipase     Component Value Date/Time   LIPASE 28 05/28/2017 1719   Prior to Admission medications   Medication Sig  Start Date End Date Taking? Authorizing Provider  amoxicillin (AMOXIL) 500 MG capsule Take 500 mg by mouth 2 (two) times daily.   Yes [provider]  ibuprofen (ADVIL,MOTRIN) 200 MG tablet Take 200 mg by mouth every 6 (six) hours as needed.   Yes [provider]  ranitidine (ZANTAC) 150 MG tablet Take 150 mg by mouth daily.   Yes [provider]  Na Sulfate-K Sulfate-Mg Sulf 17.5-3.13-1.6 GM/180ML SOLN Take 1 kit by mouth as directed. Patient not taking: Reported on 05/28/2017 10/25/16   Levin Erp, PA   . sodium chloride 125 mL/hr at 05/30/17 0935  . piperacillin-tazobactam (ZOSYN)  IV Stopped (05/30/17 0935)   Anti-infectives (From admission, onward)   Start     Dose/Rate Route Frequency Ordered Stop   05/29/17 0045  piperacillin-tazobactam (ZOSYN) IVPB 3.375 g     3.375 g 12.5 mL/hr over 240 Minutes Intravenous Every 8 hours 05/29/17 0036     05/29/17 0030  piperacillin-tazobactam (ZOSYN) IVPB 3.375 g     3.375 g 100 mL/hr over 30 Minutes Intravenous  Once 05/29/17 0025 05/29/17 0113        Medications: . enoxaparin (LOVENOX) injection  40 mg Subcutaneous Daily    Assessment/Plan Diverticulitis with contained perforation 05/29/17 Prior perforation 05/2013   FEN:  IV fluids/NPO ID:  Zosyn 05/29/17:  Day 2 DVT:  Lovenox Foley:  None Follow up:  TBD  Plan: Advance diet, continue IV antibiotics.  If he does well we may be able to transition to PO antibiotics and send home tomorrow.          LOS: 1 day    Khing Belcher 05/30/2017 [phone]

## 2017-05-31 LAB — CBC
HEMATOCRIT: 35.9 % — AB (ref 39.0–52.0)
Hemoglobin: 10.9 g/dL — ABNORMAL LOW (ref 13.0–17.0)
MCH: 24.2 pg — AB (ref 26.0–34.0)
MCHC: 30.4 g/dL (ref 30.0–36.0)
MCV: 79.8 fL (ref 78.0–100.0)
PLATELETS: 255 10*3/uL (ref 150–400)
RBC: 4.5 MIL/uL (ref 4.22–5.81)
RDW: 13.4 % (ref 11.5–15.5)
WBC: 6.4 10*3/uL (ref 4.0–10.5)

## 2017-05-31 MED ORDER — SACCHAROMYCES BOULARDII 250 MG PO CAPS: ORAL_CAPSULE | ORAL | Status: DC

## 2017-05-31 MED ORDER — METRONIDAZOLE 500 MG PO TABS: 500.0000 mg | ORAL_TABLET | Freq: Three times a day (TID) | ORAL | 0 refills | Status: AC

## 2017-05-31 MED ORDER — CIPROFLOXACIN HCL 500 MG PO TABS: 500.0000 mg | ORAL_TABLET | Freq: Two times a day (BID) | ORAL | 0 refills | Status: AC

## 2017-05-31 MED ORDER — ACETAMINOPHEN 325 MG PO TABS: ORAL_TABLET | ORAL | Status: AC

## 2017-05-31 MED ORDER — SACCHAROMYCES BOULARDII 250 MG PO CAPS: 250.0000 mg | ORAL_CAPSULE | Freq: Two times a day (BID) | ORAL | Status: DC

## 2017-05-31 NOTE — Discharge Instructions (Signed)
Vim ti th?a Diverticulitis Vim ti th?a l tnh tr?ng vim ho?c nhi?m trng cc ti nh? (ti th?a) trong ??i trng hnh thnh do m?t tnh tr?ng g?i l ti th?a ??i trng. Ti th?a c th? b?y phn (phn) v vi khu?n, gy nhi?m trng v vim. Vim ti th?a c th? gy ?au b?ng d? d?i v tiu ch?y. N c th? d?n ??n t?n th??ng m trong ??i trng gy ch?y mu. Ti th?a c?ng c th? n? (v?) v lm cho phn ? nhi?m khu?n ?i vo cc khu v?c khc c?a b?ng. Bi?n ch?ng c?a vim ti th?a c th? bao g?m:  Ch?y mu.  Nhi?m trng n?ng.  ?au r?t nhi?u.  V? (th?ng) ??i trng.  T?c ngh?n (t?c) ??i trng.  Nguyn nhn g gy ra? Tnh tr?ng ny l do phn b? b?y trong ti th?a khi?n vi khu?n pht tri?n trong ti th?a. Qua ? d?n ??n vim v nhi?m trng. ?i?u g lm t?ng nguy c?? Qu v? d? b? tnh tr?ng ny h?n n?u:  Qu v? c ti th?a ??i trng. Nguy c? c ti th?a ??i trng t?ng ln n?u: ? Qu v? th?a cn ho?c bo ph. ? Qu v? s? d?ng cc s?n ph?m thu?c l. ? Qu v? khng t?p th? du?c ??y ?u?[place] v? ?n ch? ?? ?n khng c ?? ch?t x?. Th?c ph?m giu ch?t x? bao g?m tri cy, rau c?, ??u, qu? h?ch v ng? c?c nguyn h?t.  Cc d?u hi?u ho?c tri?u ch?ng l g? Nh?ng tri?u ch?ng c?a tnh tr?ng ny c th? bao g?m:  ?au v nh?y c?m ?au ? b?ng. C?n ?au th??ng x?y ra ? pha bn tri b?ng, nh?ng c th? x?y ra ? c? nh?ng vng khc.  S?t v ?n l?nh.  ??y h?i.  ?au qu?n.  Bu?n nn.  Nn.  Thay ??i thi quen ??i ti?n.  Mu trong phn.  Ch?n ?on tnh tr?ng ny nh? th? no? Tnh tr?ng ny c th? ???c ch?n ?on d?a vo:  B?nh s? c?a qu v?.  Khm th?c th?.  Xt nghi?m ?? ??m b?o r?ng khng c nguyn nhn no khc gy ra tnh tr?ng c?a qu v?. Nh??ng xt nghi?m ny c th? bao g?m: ? Xt nghi?m mu. ? Xt nghi?m n??c ti?u. ? Cc ki?m tra hnh ?nh ? b?ng, bao g?m ch?p X quang, siu m, ch?p MRI, ho?c ch?p CT.  Tnh tr?ng ny ???c ?i?u tr? nh? th? no? H?u h?t cc tr??ng h?p b? tnh tr?ng  ny ??u nh? v c th? ?i?u tr? ? nh. ?i?u tr? c th? bao g?m:  Dng thu?c gi?m ?au khng k ??n.  Th?c hi?n ch? ?? ?n ?? l?ng trong.  U?ng thu?c khng sinh.  Ngh? ng?i.  Nh?ng tr??ng h?p n?ng h?n c th? c?n ph?i ?i?u tr? ? b?nh vi?n. ?i?u tr? c th? bao g?m:  Khng ?n ho?c khng u?ng.  Dng thu?c gi?m ?au k ??n.  Dng thu?c khng sinh qua ???ng truy?n t?nh m?ch (IV).  Nh?n d?ch v ch?t dinh d??ng qua m?t ???ng truy?n t?nh m?ch.  Ph?u thu?t.  Khi tnh tr?ng c?a qu v? ???c ki?m sot, chuyn gia ch?m [place] s?c kh?e c th? khuy?n ngh? qu v? lm th? thu?t n?i soi ??i trng. N?i soi ??i trng l ?? ki?m tra ton b? ru?t gi. Trong qu trnh th?m khm, m?t ?ng ???c bi tr?n, c th? u?n cong ???c ??a vo trong h?u mn v sau ?  vo tr?c trng, ??i trng v cc ph?n khc c?a ru?t gi. N?i soi ??i trng c th? cho bi?t cc ti th?a b? n?ng nh? th? no v li?u c th? cn b?nh g khc gy ra cc tri?u ch?ng c?a qu v? hay khng. Tun th? nh?ng h??ng d?n ny ? nh: Thu?c  Ch? s? d?ng thu?c khng k ??n v thu?c k ??n theo ch? d?n c?a chuyn gia ch?m [place] s?c kh?e. Nh?ng thu?c ny bao g?m thu?c b? sung ch?t x?, probiotic v thu?c lm m?m phn.  N?u qu v? ???c k thu?c khng sinh, hy dng thu?c theo ch? d?n c?a chuyn gia ch?m [place] s?c kh?e. Khng d?ng u?ng thu?c khng sinh ngay c? khi qu v? b?t ??u c?m th?y ?? h?n.  Khng li xe ho?c v?n hnh my mc h?ng n?ng trong khi dng thu?c gi?m ?au ???c k ??n. H??ng d?n chung  Th?c hi?n ch? ?? ?n ton ch?t l?ng ho?c ch? ?? ?n khc theo ch? d?n c?a chuyn gia ch?m [place] s?c kh?e. Sau khi cc tri?u ch?ng c?i thi?n, chuyn gia ch?m [place] s?c kh?e c th? cho qu v? thay ??i ch? ?? ?n. Chuyn gia ch?m [place] s?c kh?e c th? [place] qu v? ?n ch? ?? ?n ch?a t?i thi?u 25 g (25 gam) ch?t x? hng ngy. Ch?t x? lm phn ???c t?ng ra d? h?n. Ngu?n ch?t x? c l?i cho s?c kh?e bao g?m: ? Qu? m?ng. M?t ly ch?a 4-8 gam ch?t x?. ? ??u ho?c ??u l?ng. M?t n?a ly ch?a 5-8 gam  ch?t x?. ? [place]. M?t ly ch?a 4 gam ch?t x?.  T?p th? d?c t?i thi?u 30 pht, 3 l?n m?i tu?n. Qu v? nn t?p th? d?c ?? n?ng ?? nng nh?p tim v ?? m? hi.  Tun th? t?t c? cc l?n khm theo di theo ch? d?n c?a chuyn gia ch?m [place] s?c kh?e. ?i?u ny c vai tr quan tr?ng. Qu v? c th? c?n n?i soi ??i trng. Hy lin l?c v?i chuyn gia ch?m [place] s?c kh?e n?u:  C?n ?au c?a qu v? khng ??.  Qu v? kh u?ng ho?c kh ?n ?? ?n.  V?n ?? ??i ti?n c?a qu v? khng tr? l?i bnh th??ng. Yu c?u tr? gip ngay l?p t?c n?u:  C?n ?au tr? nn tr?m tr?ng h?n.  Cc tri?u ch?ng c?a qu v? khng ?? h?n sau khi ???c ?i?u tr?.  Tri?u ch?ng c?a qu v? ??t nhin tr?m tr?ng h?n.  Qu v? b? s?t.  Qu v? nn trn m?t l?n.  Phn c?a qu v? c mu, c mu ?en, ho?c mu h?c n. Tm t?t  Vim ti th?a l tnh tr?ng vim ho?c nhi?m trng cc ti nh? (ti th?a) trong ??i trng hnh thnh do m?t tnh tr?ng g?i l ti th?a ??i trng. Ti th?a c th? b?y phn (phn) v vi khu?n, gy nhi?m trng v vim.  Qu v? c nguy c? b? tnh tr?ng ny cao h?n n?u qu v? c ti th?a ??i trng v qu v? ?n ch? ?? khng c ?? ch?t x?.  H?u h?t cc tr??ng h?p b? tnh tr?ng ny ??u nh? v c th? ?i?u tr? ? nh. Nh?ng tr??ng h?p n?ng h?n c th? c?n ph?i ?i?u tr? ? b?nh vi?n.  Khi tnh tr?ng c?a qu v? ???c ki?m sot, chuyn gia ch?m  s?c kh?e c th? khuy?n ngh? qu v? lm th? thu?t n?i soi ??i trng. Vi?c th?m khm ny  c th? cho bi?t cc ti th?a b? n?ng nh? th? no v li?u c cn b?nh g khc gy ra cc tri?u ch?ng c?a qu v? hay khng. Thng tin ny khng nh?m m?c ?ch thay th? cho l?i khuyn m chuyn gia ch?m [place] s?c kh?e ni v?i qu v?. Hy b?o ??m qu v? ph?i th?o lu?n b?t k? v?n ?? g m qu v? c v?i chuyn gia ch?m [place] s?c kh?e c?a qu v?. Document Released: 02/27/2005 Document Revised: 09/20/2016 Document Reviewed: 09/20/2016 Elsevier Interactive Patient Education  2018 Elsevier Inc. Soft-Food Meal Plan for at  least the next 2 weeks. A soft-food meal plan includes foods that are safe and easy to swallow. This meal plan typically is used:  If you are having trouble chewing or swallowing foods.  As a transition meal plan after only having had liquid meals for a long period.  What do I need to know about the soft-food meal plan? A soft-food meal plan includes tender foods that are soft and easy to chew and swallow. In most cases, bite-sized pieces of food are easier to swallow. A bite-sized piece is about  inch or smaller. Foods in this plan do not need to be ground or pureed. Foods that are very hard, crunchy, or sticky should be avoided. Also, breads, cereals, yogurts, and desserts with nuts, seeds, or fruits should be avoided. What foods can I eat? Grains Rice and wild rice. Moist bread, dressing, pasta, and noodles. Well-moistened dry or cooked cereals, such as farina (cooked wheat cereal), oatmeal, or grits. Biscuits, breads, muffins, pancakes, and waffles that have been well moistened. Vegetables Shredded lettuce. Cooked, tender vegetables, including potatoes without skins. Vegetable juices. Broths or creamed soups made with vegetables that are not stringy or chewy. Strained tomatoes (without seeds). Fruits Canned or well-cooked fruits. Soft (ripe), peeled fresh fruits, such as peaches, nectarines, kiwi, cantaloupe, honeydew melon, and watermelon (without seeds). Soft berries with small seeds, such as strawberries. Fruit juices (without pulp). Meats and Other Protein Sources Moist, tender, lean beef. Mutton. Lamb. Veal. Chicken. [place]. Liver. Ham. Fish without bones. Eggs. Dairy Milk, milk drinks, and cream. Plain cream cheese and cottage cheese. Plain yogurt. Sweets/Desserts Flavored gelatin desserts. Custard. Plain ice cream, frozen yogurt, sherbet, milk shakes, and malts. Plain cakes and cookies. Plain hard candy. Other Butter, margarine (without trans fat), and cooking oils. Mayonnaise.  Cream sauces. Mild spices, salt, and sugar. Syrup, molasses, honey, and jelly. The items listed above may not be a complete list of recommended foods or beverages. Contact your dietitian for more options. What foods are not recommended? Grains Dry bread, toast, crackers that have not been moistened. Coarse or dry cereals, such as bran, granola, and shredded wheat. Tough or chewy crusty breads, such as [place] bread or baguettes. Vegetables Corn. Raw vegetables except shredded lettuce. Cooked vegetables that are tough or stringy. Tough, crisp, fried potatoes and potato skins. Fruits Fresh fruits with skins or seeds or both, such as apples, pears, or grapes. Stringy, high-pulp fruits, such as papaya, pineapple, coconut, or mango. Fruit leather, fruit roll-ups, and all dried fruits. Meats and Other Protein Sources Sausages and hot dogs. Meats with gristle. Fish with bones. Nuts, seeds, and chunky peanut or other nut butters. Sweets/Desserts Cakes or cookies that are very dry or chewy. The items listed above may not be a complete list of foods and beverages to avoid. Contact your dietitian for more information. This information is not intended to replace advice given to you by  your health care provider. Make sure you discuss any questions you have with your health care provider. Document Released: 08/27/2007 Document Revised: 10/26/2015 Document Reviewed: 04/16/2013 Elsevier Interactive Patient Education  2017 [employer]

## 2017-05-31 NOTE — Progress Notes (Signed)
    CC: Diverticulitis/abdominal pain  Subjective: Patient feels well this morning no abdominal pain no discomfort.  Tolerating p.o. medicines well.  Anxious to go home.  I also talked to his daughter who works here at the hospital.  Objective: Vital signs in last 24 hours: Temp:  [98.4 F (36.9 C)-98.8 F (37.1 C)] 98.8 F (37.1 C) (12/29 0440) Pulse Rate:  [57-68] 59 (12/29 0440) Resp:  [20] 20 (12/29 0440) BP: (112-128)/(61-78) 112/61 (12/29 0440) SpO2:  [96 %-99 %] 99 % (12/29 0440) Last BM Date: 05/28/17 Afebrile vital signs are stable. WBC is normal. Intake/Output from previous day: 12/28 0701 - 12/29 0700 In: 3034.2 [P.O.:180; I.V.:2854.2] Out: -  Intake/Output this shift: No intake/output data recorded.  General appearance: alert, cooperative and no distress Resp: clear to auscultation bilaterally GI: soft, non-tender; bowel sounds normal; no masses,  no organomegaly  Lab Results:  Recent Labs    05/29/17 0508 05/31/17 0651  WBC 7.5 6.4  HGB 11.4* 10.9*  HCT 36.0* 35.9*  PLT 251 255    BMET Recent Labs    05/28/17 1719 05/29/17 0508  NA 136 136  K 4.2 4.0  CL 97* 102  CO2 27 27  GLUCOSE 92 96  BUN 12 9  CREATININE 1.00 0.94  CALCIUM 9.1 8.7*   PT/INR No results for input(s): LABPROT, INR in the last 72 hours.  Recent Labs  Lab 05/28/17 1719  AST 69*  ALT 76*  ALKPHOS 97  BILITOT 0.7  PROT 8.9*  ALBUMIN 3.4*     Lipase     Component Value Date/Time   LIPASE 28 05/28/2017 1719     Medications: . ciprofloxacin  500 mg Oral BID  . enoxaparin (LOVENOX) injection  40 mg Subcutaneous Daily  . metroNIDAZOLE  500 mg Oral Q8H    Assessment/Plan Diverticulitis with contained perforation 05/29/17 Prior perforation 05/2013   FEN:  IV fluids/NPO ID:  Zosyn 05/29/17-12/29  Cipro/Flagyl 05/31/17 DVT:  Lovenox Foley:  None Follow up:  TBD     Plan: Discharge home on 10 more days of oral antibiotics.  Add a probiotic.   Follow-up in our office with Dr. Cliffton AstersWhite in 2-3 weeks.  LOS: 2 days    Laquinda Moller 05/31/2017 [phone]

## 2017-05-31 NOTE — Progress Notes (Signed)
Jonathan Boyer to be D/C'd  per MD order. Discussed with the patient and all questions fully answered.  VSS, Skin clean, dry and intact without evidence of skin break down, no evidence of skin tears noted.  IV catheter discontinued intact. Site without signs and symptoms of complications. Dressing and pressure applied.  An After Visit Summary was printed and given to the patient. Patient received prescription.  D/c education completed with patient/family including follow up instructions, medication list, d/c activities limitations if indicated, with other d/c instructions as indicated by MD - patient able to verbalize understanding, all questions fully answered.   Patient instructed to return to ED, call 911, or call MD for any changes in condition.   Patient to be escorted via WC, and D/C home via private auto.

## 2017-06-05 NOTE — Discharge Summary (Signed)
Physician Discharge Summary  Patient ID: Jonathan Boyer MRN: [id number] DOB/AGE: April 07, 1956 62 y.o.  Admit date: 05/28/2017 Discharge date: 05/31/17  Admission Diagnoses: Diverticulitis with contained perforation12/27/18 Prior perforation 05/2013  Discharge Diagnoses:  Same  Active Problems:   Diverticulitis   PROCEDURES: none  Hospital Course:  62yo man who [facility] to the [facility] with abdominal pain and constipation. Has a history of perforated diverticulitis in Dec 2014 managed with IV abx and bowel rest. Sigmoid colectomy was recommended upon recovery but he did not pursue this as he did not have insurance at the time. Has been having intermittent LLQ pain since then, and this bout began about 3 days ago (Sunday) and has been worsening. Associated with emesis and nausea (started yesterday). Last BM was yesterday and was small, no melena or hematochezia. No known fever. Does feel achy all over and some chills.  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   Patient was seen in the emergency department and admitted by Dr. Twana Firsthelsea Conner.  He was placed on IV antibiotics 12/27 through 12/29.  He made good progress and was converted to oral Flagyl and Cipro on 05/31/17.  His diet was advanced and he was ready for discharge later that day.  He was discharged home on 10 more days of oral antibiotics with plans to follow-up in our office in 2-3 weeks to see Dr. Cliffton AstersWhite.  Condition on discharge: Improved.  CBC Latest Ref Rng & Units 05/31/2017 05/29/2017 05/28/2017  WBC 4.0 - 10.5 K/uL 6.4 7.5 10.0  Hemoglobin 13.0 - 17.0 g/dL 10.9(L) 11.4(L) 12.3(L)  Hematocrit 39.0 - 52.0 % 35.9(L) 36.0(L) 39.6  Platelets 150 - 400 K/uL 255 251 280   CMP Latest Ref Rng & Units 05/29/2017 05/28/2017 06/02/2013  Glucose 65 - 99 mg/dL 96 92 95  BUN 6 - 20 mg/dL 9 12 12   Creatinine 0.61 - 1.24 mg/dL 1.190.94 1.471.00 8.291.00  Sodium 135 - 145 mmol/L 136 136 139  Potassium 3.5 - 5.1 mmol/L  4.0 4.2 4.0  Chloride 101 - 111 mmol/L 102 97(L) 101  CO2 22 - 32 mmol/L 27 27 28   Calcium 8.9 - 10.3 mg/dL 5.6(O8.7(L) 9.1 9.5  Total Protein 6.5 - 8.1 g/dL - 8.9(H) -  Total Bilirubin 0.3 - 1.2 mg/dL - 0.7 -  Alkaline Phos 38 - 126 U/L - 97 -  AST 15 - 41 U/L - 69(H) -  ALT 17 - 63 U/L - 76(H) -   Condition on discharge: Improved.   Disposition: 01-Home or Self Care   Allergies as of 05/31/2017   No Known Allergies     Medication List    STOP taking these medications   amoxicillin 500 MG capsule Commonly known as:  AMOXIL   Na Sulfate-K Sulfate-Mg Sulf 17.5-3.13-1.6 GM/177ML Soln     TAKE these medications   acetaminophen 325 MG tablet Commonly known as:  TYLENOL Follow package instructions.  If he has recurrent pain call for follow-up appointment.   ciprofloxacin 500 MG tablet Commonly known as:  CIPRO Take 1 tablet (500 mg total) by mouth 2 (two) times daily for 10 days.   ibuprofen 200 MG tablet Commonly known as:  ADVIL,MOTRIN Take 200 mg by mouth every 6 (six) hours as needed.   metroNIDAZOLE 500 MG tablet Commonly known as:  FLAGYL Take 1 tablet (500 mg total) by mouth every 8 (eight) hours for 10 days.   ranitidine 150 MG tablet Commonly known as:  ZANTAC  Take 150 mg by mouth daily.   saccharomyces boulardii 250 MG capsule Commonly known as:  FLORASTOR You can buy this at any drugstore over-the-counter.  Follow package instructions and complete 28-day course.      Follow-up Information    Andria Meuse, MD Follow up.   Specialty:  General Surgery Why:  Call for an appointment in 3 weeks.  Be at the office 30 minutes early for check in.  Bring a Building services engineer ID and insurance information. Contact information: [address] [place] [address] [phone]           Signed: Sherrie George 06/05/2017, 2:26 PM

## 2017-06-16 DIAGNOSIS — K5792 Diverticulitis of intestine, part unspecified, without perforation or abscess without bleeding: Secondary | ICD-10-CM | POA: Diagnosis not present

## 2017-06-19 ENCOUNTER — Encounter: Payer: Self-pay | Admitting: Gastroenterology

## 2017-07-16 ENCOUNTER — Ambulatory Visit (AMBULATORY_SURGERY_CENTER): Payer: Self-pay | Admitting: *Deleted

## 2017-07-16 ENCOUNTER — Other Ambulatory Visit: Payer: Self-pay

## 2017-07-16 ENCOUNTER — Telehealth: Payer: Self-pay | Admitting: *Deleted

## 2017-07-16 VITALS — Ht 64.0 in | Wt 145.0 lb

## 2017-07-16 DIAGNOSIS — Z8719 Personal history of other diseases of the digestive system: Secondary | ICD-10-CM

## 2017-07-16 DIAGNOSIS — Z1211 Encounter for screening for malignant neoplasm of colon: Secondary | ICD-10-CM

## 2017-07-16 DIAGNOSIS — Z1212 Encounter for screening for malignant neoplasm of rectum: Secondary | ICD-10-CM

## 2017-07-16 MED ORDER — NA SULFATE-K SULFATE-MG SULF 17.5-3.13-1.6 GM/177ML PO SOLN: ORAL | 0 refills | Status: AC

## 2017-07-16 NOTE — Telephone Encounter (Addendum)
Notified pt's daughter,Evelyn at [phone]. OV made with Victorino DikeJennifer PA for Monday 07/21/17 at 3:15 pm. Colonoscopy remains on the schedule for 07/29/17.

## 2017-07-16 NOTE — Telephone Encounter (Signed)
Patient here for PV for upcoming colon on 07/29/17. Daughter is with him and is very concern. He did have ov w/Jennifer on 10/25/16 and was schedule colon but he cancelled this because of bills adding up per daughter. She would like to make sure it is okay for him to proceed with colonoscopy at this time since he has had recent ED visit on 05/28/17 for diverticulitis w/ perforation. He still having pain in abdomen at times and when this happens he takes a amoxicillin he has at home. He has only been able to drink ensure, does not eat much, only rice soup.  Suprep prep given to patient per daughter request because he was given that during last ov.  Ok for patient to go ahead with [facility] on 07[facility]? Please advise. Thank you,Yossi Hinchman pv

## 2017-07-16 NOTE — Telephone Encounter (Signed)
He needs ROV (first available with me or extender) prior to any decision about colonoscopy. He had extraluminal air collection 6 weeks ago (locally perfed diverticulitis) and is somehow taking PRN antibiotics.    thanks

## 2017-07-16 NOTE — Progress Notes (Signed)
Patient's daughter her with him and is interpreting. Patient denies any allergies to eggs or soy. Patient denies any problems with anesthesia/sedation. Patient denies any oxygen use at home. Patient denies taking any diet/weight loss medications or blood thinners. Phone note sent to Dr.Jacobs. Suprep coupon given to pt.'s daughter.

## 2017-07-21 ENCOUNTER — Ambulatory Visit (INDEPENDENT_AMBULATORY_CARE_PROVIDER_SITE_OTHER): Payer: BLUE CROSS/BLUE SHIELD | Admitting: Physician Assistant

## 2017-07-21 ENCOUNTER — Encounter: Payer: Self-pay | Admitting: Physician Assistant

## 2017-07-21 VITALS — BP 144/90 | HR 70 | Ht 64.0 in | Wt 149.0 lb

## 2017-07-21 DIAGNOSIS — R1032 Left lower quadrant pain: Secondary | ICD-10-CM

## 2017-07-21 DIAGNOSIS — R194 Change in bowel habit: Secondary | ICD-10-CM

## 2017-07-21 DIAGNOSIS — K5720 Diverticulitis of large intestine with perforation and abscess without bleeding: Secondary | ICD-10-CM | POA: Diagnosis not present

## 2017-07-21 NOTE — Patient Instructions (Addendum)
You have been scheduled for a CT scan of the abdomen and pelvis at [facility].  You are scheduled on 07/29/17  At 12:50 pm. You should arrive 20  minutes prior to your appointment time for registration. Please follow the written instructions below on the day of your exam:  WARNING: IF YOU ARE ALLERGIC TO IODINE/X-RAY DYE, PLEASE NOTIFY RADIOLOGY IMMEDIATELY AT [phone]! YOU WILL BE GIVEN A 13 HOUR PREMEDICATION PREP.  1) Do not eat anything after 8:50 am (4 hours prior to your test) 2) You have been given 2 bottles of oral contrast to drink. The solution may taste               better if refrigerated, but do NOT add ice or any other liquid to this solution. Shake             well before drinking.    Drink 1 bottle of contrast @ 10:50 am (2 hours prior to your exam)  Drink 1 bottle of contrast @ 11:50 am  (1 hour prior to your exam)  You may take any medications as prescribed with a small amount of water except for the following: Metformin, Glucophage, Glucovance, Avandamet, Riomet, Fortamet, Actoplus Met, Janumet, Glumetza or Metaglip. The above medications must be held the day of the exam AND 48 hours after the exam.  The purpose of you drinking the oral contrast is to aid in the visualization of your intestinal tract. The contrast solution may cause some diarrhea. Before your exam is started, you will be given a small amount of fluid to drink. Depending on your individual set of symptoms, you may also receive an intravenous injection of x-ray contrast/dye. Plan on being at [facility] for 30 minutes or longer, depending on the type of exam you are having performed.  This test typically takes 30-45 minutes to complete.  If you have any questions regarding your exam or if you need to reschedule, you may call the CT department at [phone] between the hours of 8:00 am and 5:00 pm, Monday-Friday.  ________________________________________________________________________

## 2017-07-21 NOTE — Progress Notes (Signed)
Chief Complaint: History of diverticulitis of the colon with perforation  HPI:    [name] is a 62 year old [place] male who follows in our clinic with Dr. Christella HartiganJacobs and presents to clinic today due to recent history of hospitalization with diverticulitis complicated by perforation.    Please note patient was scheduled for a colonoscopy next week, he was added to my schedule over concern regarding recent hospitalization with diverticular perforation and continued left lower quadrant pain described at his pre-visit.    Per review of chart patient seen in hospital 05/28/17-05/31/17 with left lower quadrant pain and CT 05/28/17 showing acute diverticulitis at the proximal sigmoid colon with 1.6 cm extraluminal collection of fluid and air adjacent to the proximal sigmoid colon and diffuse surrounding soft tissue inflammation.  This is compatible with contained perforation.  Patient improved with IV antibiotics and bowel rest over a period of 2/3 days and did not wish to pursue sigmoid colectomy as he did not have insurance at that time.  He was discharged on further oral antibiotics Cipro and Flagyl for 10 more days.    Today, patient presents to clinic and explains that he did feel better when in the hospital on IV antibiotics, he felt well for about a month afterwards, but over the past week or so he has had an increase again in left lower quadrant pain and describes stools with some "mucus/white stuff" and constipation.  He tells me this "does not feel as bad as it did when I was in the hospital".  This is more of an intermittent discomfort rated as a 7-8/10.    Patient denies fever, chills, nausea, vomiting or symptoms that awaken him at night.  Past Medical History:  Diagnosis Date  . Diverticular disease 06/01/2013  . Medical history non-contributory     Past Surgical History:  Procedure Laterality Date  . NO PAST SURGERIES      Current Outpatient Medications  Medication Sig Dispense  Refill  . acetaminophen (TYLENOL) 325 MG tablet Follow package instructions.  If he has recurrent pain call for follow-up appointment.    . AMOXICILLIN PO Take 1 tablet by mouth daily.    Marland Kitchen. ibuprofen (ADVIL,MOTRIN) 200 MG tablet Take 200 mg by mouth every 6 (six) hours as needed.    . Na Sulfate-K Sulfate-Mg Sulf 17.5-3.13-1.6 GM/177ML SOLN Suprep (no substitutions)-TAKE AS DIRECTED.  Pay no more than $50 coupon given to pt. 354 mL 0  . Probiotic Product (PROBIOTIC DAILY PO) Take 1 tablet by mouth daily.    . ranitidine (ZANTAC) 150 MG tablet Take 150 mg by mouth daily.     No current facility-administered medications for this visit.     Allergies as of 07/21/2017  . (No Known Allergies)    Family History  Problem Relation Age of Onset  . Cancer Mother        blood  . Stroke Father   . Colon cancer Neg Hx   . Stomach cancer Neg Hx     Social History   Socioeconomic History  . Marital status: Married    Spouse name: Not on file  . Number of children: Not on file  . Years of education: Not on file  . Highest education level: Not on file  Social Needs  . Financial resource strain: Not on file  . Food insecurity - worry: Not on file  . Food insecurity - inability: Not on file  . Transportation needs - medical: Not on file  . Transportation  needs - non-medical: Not on file  Occupational History  . Not on file  Tobacco Use  . Smoking status: Never Smoker  . Smokeless tobacco: Never Used  Substance and Sexual Activity  . Alcohol use: No    Frequency: Never  . Drug use: No  . Sexual activity: Not on file  Other Topics Concern  . Not on file  Social History Narrative  . Not on file    Review of Systems:    Constitutional: No weight loss, fever or chills Cardiovascular: No chest pain Respiratory: No SOB  Gastrointestinal: See HPI and otherwise negative   Physical Exam:  Vital signs: BP (!) 144/90   Pulse 70   Ht 5\' 4"  (1.626 m)   Wt 149 lb (67.6 kg)   BMI 25.58  kg/m   Constitutional:   Pleasant [place] male appears to be in NAD, Well developed, Well nourished, alert and cooperative Head:  Normocephalic and atraumatic. Eyes:   PEERL, EOMI. No icterus. Conjunctiva pink. Ears:  Normal auditory acuity. Neck:  Supple Throat: Oral cavity and pharynx without inflammation, swelling or lesion.  Respiratory: Respirations even and unlabored. Lungs clear to auscultation bilaterally.   No wheezes, crackles, or rhonchi.  Cardiovascular: Normal S1, S2. No MRG. Regular rate and rhythm. No peripheral edema, cyanosis or pallor.  Gastrointestinal:  Soft, nondistended, Moderate LLQ ttp. No rebound or guarding. Normal bowel sounds. No appreciable masses or hepatomegaly. Psychiatric: Demonstrates good judgement and reason without abnormal affect or behaviors.  RELEVANT LABS AND IMAGING: CBC    Component Value Date/Time   WBC 6.4 05/31/2017 0651   RBC 4.50 05/31/2017 0651   HGB 10.9 (L) 05/31/2017 0651   HCT 35.9 (L) 05/31/2017 0651   PLT 255 05/31/2017 0651   MCV 79.8 05/31/2017 0651   MCH 24.2 (L) 05/31/2017 0651   MCHC 30.4 05/31/2017 0651   RDW 13.4 05/31/2017 0651   LYMPHSABS 1.5 06/01/2013 1745   MONOABS 0.4 06/01/2013 1745   EOSABS 0.1 06/01/2013 1745   BASOSABS 0.0 06/01/2013 1745    CMP     Component Value Date/Time   NA 136 05/29/2017 0508   K 4.0 05/29/2017 0508   CL 102 05/29/2017 0508   CO2 27 05/29/2017 0508   GLUCOSE 96 05/29/2017 0508   BUN 9 05/29/2017 0508   CREATININE 0.94 05/29/2017 0508   CALCIUM 8.7 (L) 05/29/2017 0508   PROT 8.9 (H) 05/28/2017 1719   ALBUMIN 3.4 (L) 05/28/2017 1719   AST 69 (H) 05/28/2017 1719   ALT 76 (H) 05/28/2017 1719   ALKPHOS 97 05/28/2017 1719   BILITOT 0.7 05/28/2017 1719   GFRNONAA >60 05/29/2017 0508   GFRAA >60 05/29/2017 0508   EXAM: CT ABDOMEN AND PELVIS WITH CONTRAST 05/18/18  TECHNIQUE: Multidetector CT imaging of the abdomen and pelvis was performed using the standard protocol  following bolus administration of intravenous contrast.  CONTRAST:  ISOVUE-300 IOPAMIDOL (ISOVUE-300) INJECTION 61%  COMPARISON:  CT of the abdomen and pelvis from 05/31/2013  FINDINGS: Lower chest: Minimal bibasilar atelectasis is noted. The visualized portions of the mediastinum are unremarkable.  Hepatobiliary: The liver is unremarkable in appearance. The gallbladder is unremarkable in appearance. The common bile duct remains normal in caliber.  Pancreas: The pancreas is within normal limits.  Spleen: The spleen is unremarkable in appearance.  Adrenals/Urinary Tract: The adrenal glands are unremarkable in appearance. The kidneys are within normal limits. There is no evidence of hydronephrosis. No renal or ureteral stones are identified. No perinephric  stranding is seen.  Stomach/Bowel: The stomach is unremarkable in appearance. The small bowel is within normal limits. The appendix is normal in caliber, without evidence of appendicitis.  Scattered diverticulosis is noted along the descending and sigmoid colon. There is a small 1.6 cm extraluminal collection of fluid and air noted adjacent to the proximal sigmoid colon, with diffuse surrounding soft tissue inflammation. This is compatible with contained perforation secondary to acute diverticulitis. Minimal peripheral enhancement is noted, raising concern for abscess formation. Soft tissue inflammation extends superiorly along the mesentery.  The small bowel is grossly unremarkable in appearance, though soft tissue inflammation tracks about small bowel loops. The stomach is unremarkable in appearance.  Vascular/Lymphatic: The abdominal aorta is unremarkable in appearance. The inferior vena cava is grossly unremarkable. No retroperitoneal lymphadenopathy is seen. No pelvic sidewall lymphadenopathy is identified.  Reproductive: The bladder is mildly distended and grossly unremarkable. The prostate  remains normal in size.  Other: No additional soft tissue abnormalities are seen.  Musculoskeletal: No acute osseous abnormalities are identified. The visualized musculature is unremarkable in appearance.  IMPRESSION: 1. Acute diverticulitis at the proximal sigmoid colon, with 1.6 cm extraluminal collection of fluid and air adjacent to the proximal sigmoid colon and diffuse surrounding soft tissue inflammation. This is compatible with contained perforation. Minimal peripheral enhancement noted, raising concern for abscess formation. Soft tissue inflammation extends superiorly along the mesentery, tracking about small bowel loops. 2. Scattered diverticulosis along the descending and sigmoid colon. These results were called by telephone at the time of interpretation on 05/28/2017 at 11:59 pm to Dr. Clarene Duke, who verbally acknowledged these results.   Electronically Signed   By: Roanna Raider M.D.   On: 05/29/2017 00:04  Assessment: 1.  History of diverticulitis with perforation: twice, in 2014 and again in 2018, recent CT above, patient initially improved with IV antibiotics, now over the past week or 2 with left lower quadrant pain and constipation as well as mucus in his stool, previously arranged for colonoscopy next week, but I do not believe this would be wise with increased risk of perforation; concern for increased fluid collection/abscess after recent perforation 2.  Left lower quadrant pain 3.  Change in bowel habits  Plan: 1.  Spent a vast majority of the appointment time discussing how sorry I am that he has bills for everything recently.  This is the patient's primary concern ahen considering a CT, though he does tell me that he has insurance this year.  I tried to describe how this works for him.  He is still very concerned regarding cost.  His daughter would like him to have the CT. 2.  Ordered CT abdomen pelvis with contrast at [facility]. 3.  If CT shows  resolution of fluid collection we can schedule a colonoscopy and then likely refer patient to surgery for elective hemicolectomy.  If continues to show fluid or worsening, will need to discuss further IV antibiotics and possibly re-consultation of the surgical team in hospital. 4. Cancelled colonoscopy next week 5.  Patient to await further recommendations after CT received above.  Hyacinth Meeker, PA-C [facility] 07/21/2017, 4:05 PM

## 2017-07-21 NOTE — Progress Notes (Signed)
I agree with the above note, plan 

## 2017-07-29 ENCOUNTER — Encounter: Payer: BLUE CROSS/BLUE SHIELD | Admitting: Gastroenterology

## 2017-07-29 ENCOUNTER — Ambulatory Visit
Admission: RE | Admit: 2017-07-29 | Discharge: 2017-07-29 | Disposition: A | Discharge status: Home / Self Care | Payer: BLUE CROSS/BLUE SHIELD | Source: Ambulatory Visit | Attending: Physician Assistant | Admitting: Physician Assistant

## 2017-07-29 DIAGNOSIS — K5720 Diverticulitis of large intestine with perforation and abscess without bleeding: Secondary | ICD-10-CM

## 2017-07-29 DIAGNOSIS — K5730 Diverticulosis of large intestine without perforation or abscess without bleeding: Secondary | ICD-10-CM | POA: Diagnosis not present

## 2017-07-29 MED ORDER — IOPAMIDOL (ISOVUE-300) INJECTION 61%
100.0000 mL | Freq: Once | INTRAVENOUS | Status: AC | PRN
Start: 2017-07-29 — End: 2017-07-29
  Administered 2017-07-29: 100 mL via INTRAVENOUS

## 2017-09-12 ENCOUNTER — Encounter: Payer: BLUE CROSS/BLUE SHIELD | Admitting: Gastroenterology

## 2018-03-26 DIAGNOSIS — Z23 Encounter for immunization: Secondary | ICD-10-CM | POA: Diagnosis not present

## 2019-02-22 ENCOUNTER — Other Ambulatory Visit: Payer: Self-pay

## 2019-02-22 DIAGNOSIS — Z20822 Contact with and (suspected) exposure to covid-19: Secondary | ICD-10-CM

## 2019-02-23 LAB — NOVEL CORONAVIRUS, NAA: SARS-CoV-2, NAA: NOT DETECTED

## 2019-02-24 ENCOUNTER — Telehealth: Payer: Self-pay | Admitting: General Practice

## 2019-02-24 NOTE — Telephone Encounter (Signed)
Negative COVID results given. Patient results "NOT Detected." Caller expressed understanding. ° °

## 2019-03-16 ENCOUNTER — Other Ambulatory Visit: Payer: Self-pay

## 2019-03-16 DIAGNOSIS — Z20822 Contact with and (suspected) exposure to covid-19: Secondary | ICD-10-CM

## 2019-03-18 LAB — NOVEL CORONAVIRUS, NAA: SARS-CoV-2, NAA: NOT DETECTED
